# Patient Record
Sex: Female | Born: 1965 | Marital: Single | Smoking: Former smoker
Health system: Southern US, Community
[De-identification: ages and names within clinical notes are randomized; demographics above are authoritative.]

## PROBLEM LIST (undated history)

## (undated) DIAGNOSIS — T7840XA Allergy, unspecified, initial encounter: Secondary | ICD-10-CM

## (undated) DIAGNOSIS — I471 Supraventricular tachycardia, unspecified: Secondary | ICD-10-CM

## (undated) DIAGNOSIS — E785 Hyperlipidemia, unspecified: Secondary | ICD-10-CM

## (undated) DIAGNOSIS — B019 Varicella without complication: Secondary | ICD-10-CM

## (undated) HISTORY — DX: Allergy, unspecified, initial encounter: T78.40XA

## (undated) HISTORY — DX: Varicella without complication: B01.9

## (undated) HISTORY — DX: Hyperlipidemia, unspecified: E78.5

---
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
## 1994-11-01 HISTORY — PX: ABDOMINAL HYSTERECTOMY: SHX81

## 2003-11-02 HISTORY — PX: CHOLECYSTECTOMY: SHX55

## 2014-08-07 DIAGNOSIS — J45909 Unspecified asthma, uncomplicated: Secondary | ICD-10-CM | POA: Insufficient documentation

## 2016-06-07 ENCOUNTER — Encounter: Payer: Self-pay | Admitting: Family Medicine

## 2016-06-07 ENCOUNTER — Ambulatory Visit (INDEPENDENT_AMBULATORY_CARE_PROVIDER_SITE_OTHER): Payer: 59 | Admitting: Family Medicine

## 2016-06-07 VITALS — BP 110/78 | HR 59 | Temp 97.7°F | Resp 20 | Ht 66.25 in | Wt 153.0 lb

## 2016-06-07 DIAGNOSIS — Z130 Encounter for screening for diseases of the blood and blood-forming organs and certain disorders involving the immune mechanism: Secondary | ICD-10-CM | POA: Diagnosis not present

## 2016-06-07 DIAGNOSIS — M2550 Pain in unspecified joint: Secondary | ICD-10-CM

## 2016-06-07 DIAGNOSIS — Z0000 Encounter for general adult medical examination without abnormal findings: Secondary | ICD-10-CM | POA: Diagnosis not present

## 2016-06-07 DIAGNOSIS — Z1329 Encounter for screening for other suspected endocrine disorder: Secondary | ICD-10-CM

## 2016-06-07 DIAGNOSIS — Z13220 Encounter for screening for lipoid disorders: Secondary | ICD-10-CM

## 2016-06-07 DIAGNOSIS — E785 Hyperlipidemia, unspecified: Secondary | ICD-10-CM

## 2016-06-07 DIAGNOSIS — Z7189 Other specified counseling: Secondary | ICD-10-CM | POA: Diagnosis not present

## 2016-06-07 DIAGNOSIS — Z1321 Encounter for screening for nutritional disorder: Secondary | ICD-10-CM | POA: Diagnosis not present

## 2016-06-07 DIAGNOSIS — Z131 Encounter for screening for diabetes mellitus: Secondary | ICD-10-CM | POA: Diagnosis not present

## 2016-06-07 DIAGNOSIS — Z7689 Persons encountering health services in other specified circumstances: Secondary | ICD-10-CM

## 2016-06-07 LAB — CBC WITH DIFFERENTIAL/PLATELET
BASOS ABS: 0 {cells}/uL (ref 0–200)
Basophils Relative: 0 %
EOS PCT: 2 %
Eosinophils Absolute: 150 cells/uL (ref 15–500)
HEMATOCRIT: 41.9 % (ref 35.0–45.0)
HEMOGLOBIN: 13.8 g/dL (ref 11.7–15.5)
LYMPHS ABS: 2325 {cells}/uL (ref 850–3900)
Lymphocytes Relative: 31 %
MCH: 29.2 pg (ref 27.0–33.0)
MCHC: 32.9 g/dL (ref 32.0–36.0)
MCV: 88.8 fL (ref 80.0–100.0)
MONO ABS: 525 {cells}/uL (ref 200–950)
MPV: 11.6 fL (ref 7.5–12.5)
Monocytes Relative: 7 %
NEUTROS ABS: 4500 {cells}/uL (ref 1500–7800)
NEUTROS PCT: 60 %
Platelets: 239 10*3/uL (ref 140–400)
RBC: 4.72 MIL/uL (ref 3.80–5.10)
RDW: 13.8 % (ref 11.0–15.0)
WBC: 7.5 10*3/uL (ref 3.8–10.8)

## 2016-06-07 NOTE — Patient Instructions (Addendum)
Fish oil: 3g daily.   Health Maintenance, Female Adopting a healthy lifestyle and getting preventive care can go a long way to promote health and wellness. Talk with your health care provider about what schedule of regular examinations is right for you. This is a good chance for you to check in with your provider about disease prevention and staying healthy. In between checkups, there are plenty of things you can do on your own. Experts have done a lot of research about which lifestyle changes and preventive measures are most likely to keep you healthy. Ask your health care provider for more information. WEIGHT AND DIET  Eat a healthy diet  Be sure to include plenty of vegetables, fruits, low-fat dairy products, and lean protein.  Do not eat a lot of foods high in solid fats, added sugars, or salt.  Get regular exercise. This is one of the most important things you can do for your health.  Most adults should exercise for at least 150 minutes each week. The exercise should increase your heart rate and make you sweat (moderate-intensity exercise).  Most adults should also do strengthening exercises at least twice a week. This is in addition to the moderate-intensity exercise.  Maintain a healthy weight  Body mass index (BMI) is a measurement that can be used to identify possible weight problems. It estimates body fat based on height and weight. Your health care provider can help determine your BMI and help you achieve or maintain a healthy weight.  For females 22 years of age and older:   A BMI below 18.5 is considered underweight.  A BMI of 18.5 to 24.9 is normal.  A BMI of 25 to 29.9 is considered overweight.  A BMI of 30 and above is considered obese.  Watch levels of cholesterol and blood lipids  You should start having your blood tested for lipids and cholesterol at 50 years of age, then have this test every 5 years.  You may need to have your cholesterol levels checked more  often if:  Your lipid or cholesterol levels are high.  You are older than 50 years of age.  You are at high risk for heart disease.  CANCER SCREENING   Lung Cancer  Lung cancer screening is recommended for adults 57-35 years old who are at high risk for lung cancer because of a history of smoking.  A yearly low-dose CT scan of the lungs is recommended for people who:  Currently smoke.  Have quit within the past 15 years.  Have at least a 30-pack-year history of smoking. A pack year is smoking an average of one pack of cigarettes a day for 1 year.  Yearly screening should continue until it has been 15 years since you quit.  Yearly screening should stop if you develop a health problem that would prevent you from having lung cancer treatment.  Breast Cancer  Practice breast self-awareness. This means understanding how your breasts normally appear and feel.  It also means doing regular breast self-exams. Let your health care provider know about any changes, no matter how small.  If you are in your 20s or 30s, you should have a clinical breast exam (CBE) by a health care provider every 1-3 years as part of a regular health exam.  If you are 15 or older, have a CBE every year. Also consider having a breast X-ray (mammogram) every year.  If you have a family history of breast cancer, talk to your health care provider  about genetic screening.  If you are at high risk for breast cancer, talk to your health care provider about having an MRI and a mammogram every year.  Breast cancer gene (BRCA) assessment is recommended for women who have family members with BRCA-related cancers. BRCA-related cancers include:  Breast.  Ovarian.  Tubal.  Peritoneal cancers.  Results of the assessment will determine the need for genetic counseling and BRCA1 and BRCA2 testing. Cervical Cancer Your health care provider may recommend that you be screened regularly for cancer of the pelvic organs  (ovaries, uterus, and vagina). This screening involves a pelvic examination, including checking for microscopic changes to the surface of your cervix (Pap test). You may be encouraged to have this screening done every 3 years, beginning at age 70.  For women ages 55-65, health care providers may recommend pelvic exams and Pap testing every 3 years, or they may recommend the Pap and pelvic exam, combined with testing for human papilloma virus (HPV), every 5 years. Some types of HPV increase your risk of cervical cancer. Testing for HPV may also be done on women of any age with unclear Pap test results.  Other health care providers may not recommend any screening for nonpregnant women who are considered low risk for pelvic cancer and who do not have symptoms. Ask your health care provider if a screening pelvic exam is right for you.  If you have had past treatment for cervical cancer or a condition that could lead to cancer, you need Pap tests and screening for cancer for at least 20 years after your treatment. If Pap tests have been discontinued, your risk factors (such as having a new sexual partner) need to be reassessed to determine if screening should resume. Some women have medical problems that increase the chance of getting cervical cancer. In these cases, your health care provider may recommend more frequent screening and Pap tests. Colorectal Cancer  This type of cancer can be detected and often prevented.  Routine colorectal cancer screening usually begins at 50 years of age and continues through 50 years of age.  Your health care provider may recommend screening at an earlier age if you have risk factors for colon cancer.  Your health care provider may also recommend using home test kits to check for hidden blood in the stool.  A small camera at the end of a tube can be used to examine your colon directly (sigmoidoscopy or colonoscopy). This is done to check for the earliest forms of  colorectal cancer.  Routine screening usually begins at age 43.  Direct examination of the colon should be repeated every 5-10 years through 50 years of age. However, you may need to be screened more often if early forms of precancerous polyps or small growths are found. Skin Cancer  Check your skin from head to toe regularly.  Tell your health care provider about any new moles or changes in moles, especially if there is a change in a mole's shape or color.  Also tell your health care provider if you have a mole that is larger than the size of a pencil eraser.  Always use sunscreen. Apply sunscreen liberally and repeatedly throughout the day.  Protect yourself by wearing long sleeves, pants, a wide-brimmed hat, and sunglasses whenever you are outside. HEART DISEASE, DIABETES, AND HIGH BLOOD PRESSURE   High blood pressure causes heart disease and increases the risk of stroke. High blood pressure is more likely to develop in:  People who have  blood pressure in the high end of the normal range (130-139/85-89 mm Hg).  People who are overweight or obese.  People who are African American.  If you are 90-68 years of age, have your blood pressure checked every 3-5 years. If you are 35 years of age or older, have your blood pressure checked every year. You should have your blood pressure measured twice--once when you are at a hospital or clinic, and once when you are not at a hospital or clinic. Record the average of the two measurements. To check your blood pressure when you are not at a hospital or clinic, you can use:  An automated blood pressure machine at a pharmacy.  A home blood pressure monitor.  If you are between 47 years and 43 years old, ask your health care provider if you should take aspirin to prevent strokes.  Have regular diabetes screenings. This involves taking a blood sample to check your fasting blood sugar level.  If you are at a normal weight and have a low risk for  diabetes, have this test once every three years after 50 years of age.  If you are overweight and have a high risk for diabetes, consider being tested at a younger age or more often. PREVENTING INFECTION  Hepatitis B  If you have a higher risk for hepatitis B, you should be screened for this virus. You are considered at high risk for hepatitis B if:  You were born in a country where hepatitis B is common. Ask your health care provider which countries are considered high risk.  Your parents were born in a high-risk country, and you have not been immunized against hepatitis B (hepatitis B vaccine).  You have HIV or AIDS.  You use needles to inject street drugs.  You live with someone who has hepatitis B.  You have had sex with someone who has hepatitis B.  You get hemodialysis treatment.  You take certain medicines for conditions, including cancer, organ transplantation, and autoimmune conditions. Hepatitis C  Blood testing is recommended for:  Everyone born from [birth year] through 1965.  Anyone with known risk factors for hepatitis C. Sexually transmitted infections (STIs)  You should be screened for sexually transmitted infections (STIs) including gonorrhea and chlamydia if:  You are sexually active and are younger than 50 years of age.  You are older than 50 years of age and your health care provider tells you that you are at risk for this type of infection.  Your sexual activity has changed since you were last screened and you are at an increased risk for chlamydia or gonorrhea. Ask your health care provider if you are at risk.  If you do not have HIV, but are at risk, it may be recommended that you take a prescription medicine daily to prevent HIV infection. This is called pre-exposure prophylaxis (PrEP). You are considered at risk if:  You are sexually active and do not regularly use condoms or know the HIV status of your partner(s).  You take drugs by injection.  You are  sexually active with a partner who has HIV. Talk with your health care provider about whether you are at high risk of being infected with HIV. If you choose to begin PrEP, you should first be tested for HIV. You should then be tested every 3 months for as long as you are taking PrEP.  PREGNANCY   If you are premenopausal and you may become pregnant, ask your health care provider about  preconception counseling.  If you may become pregnant, take 400 to 800 micrograms (mcg) of folic acid every day.  If you want to prevent pregnancy, talk to your health care provider about birth control (contraception). OSTEOPOROSIS AND MENOPAUSE   Osteoporosis is a disease in which the bones lose minerals and strength with aging. This can result in serious bone fractures. Your risk for osteoporosis can be identified using a bone density scan.  If you are 43 years of age or older, or if you are at risk for osteoporosis and fractures, ask your health care provider if you should be screened.  Ask your health care provider whether you should take a calcium or vitamin D supplement to lower your risk for osteoporosis.  Menopause may have certain physical symptoms and risks.  Hormone replacement therapy may reduce some of these symptoms and risks. Talk to your health care provider about whether hormone replacement therapy is right for you.  HOME CARE INSTRUCTIONS   Schedule regular health, dental, and eye exams.  Stay current with your immunizations.   Do not use any tobacco products including cigarettes, chewing tobacco, or electronic cigarettes.  If you are pregnant, do not drink alcohol.  If you are breastfeeding, limit how much and how often you drink alcohol.  Limit alcohol intake to no more than 1 drink per day for nonpregnant women. One drink equals 12 ounces of beer, 5 ounces of wine, or 1 ounces of hard liquor.  Do not use street drugs.  Do not share needles.  Ask your health care provider for  help if you need support or information about quitting drugs.  Tell your health care provider if you often feel depressed.  Tell your health care provider if you have ever been abused or do not feel safe at home.   This information is not intended to replace advice given to you by your health care provider. Make sure you discuss any questions you have with your health care provider.   Document Released: 05/03/2011 Document Revised: 11/08/2014 Document Reviewed: 09/19/2013 Elsevier Interactive Patient Education [employer].

## 2016-06-07 NOTE — Progress Notes (Signed)
    Patient ID: Ashley Clayton, female  DOB: 03/02/1966, 50 y.o.   MRN: [id number] Patient Care Team    Relationship Specialty Notifications Start End  Renee A Kuneff, DO PCP - General Family Medicine  06/07/16     Subjective:  Ashley Clayton is a 50 y.o.  female present for new patient establishment. All past medical history, surgical history, allergies, family history, immunizations, medications and social history were Obtained and entered in the electronic medical record today. All recent labs, ED visits and hospitalizations within the last year were reviewed.  Hyperlipidemia: Patient presents for new patient establishment and states that she has been diagnosed with hyperlipidemia and hypertriglyceridemia in the past. She used to be on simvastatin but did not feel good on that medicine, so she was changed to Lipitor low dose which she did tolerate. She states she just stopped taking the medication because she did not want to take a statin. She reports she has had triglycerides higher than 1500. She has a family history of stroke and heart disease. She states she had a "normal "stress test a few years ago because she was having chest pain, that was found to be stress related.   Arthralgia: Patient also complains of arthralgia and her proximal and distal finger joints. She states she was on prednisone for a contact dermatitis a few weeks ago, and she felt that it made her arthritis pain much improved. She is worried this means that she has rheumatoid arthritis. States that he arthritis pain is painful enough that it is affecting her daily life. She has not taken any medications routinely for this pain.    Health maintenance:  Colonoscopy: completed about 7-10 years ago, by a doctor in a different state, resutls "normal". Had completed bc of diarrhea.  Mammogram: completed: about 2 years ago, cystic changes.. Breast cancer in [facility] (dies at 20 years).  Cervical cancer screening: last pap: 2016,  normal. Pt declines further pelvic, hysterectomy.  Immunizations: tdap 03/01/2014. Influenza UTD 2016(encouraged yearly) Infectious disease screening: Completed per pt, awaiting on records.  DEXA: never Assistive device: None Oxygen use: None  Patient has a Dental home. Hospitalizations/ED visits: None   There is no immunization history on file for this patient.   Past Medical History:  Diagnosis Date  . Allergy   . Chicken pox   . Hyperlipidemia    No Known Allergies Past Surgical History:  Procedure Laterality Date  . ABDOMINAL HYSTERECTOMY    . CHOLECYSTECTOMY    . TONSILLECTOMY AND ADENOIDECTOMY     Family History  Problem Relation Age of Onset  . Arthritis Mother   . Heart disease Mother   . Diabetes Mother   . Stroke Mother   . Heart disease Father   . Hearing loss Father   . Diabetes Father   . Arthritis Father   . Arthritis Sister   . Arthritis Brother   . Mental illness Brother   . Diabetes Brother   . Hearing loss Brother   . Heart disease Brother   . Early death Maternal Uncle   . Breast cancer Maternal Grandmother   . Early death Maternal Grandmother   . Heart disease Paternal Grandmother   . Mental illness Paternal Grandmother   . Drug abuse Paternal Grandmother   . Heart disease Paternal Grandfather   . Early death Paternal Grandfather   . Stroke Paternal Grandfather    Social History   Social History  . Marital status: Single      Spouse name: N/A  . Number of children: N/A  . Years of education: N/A   Occupational History  . Not on file.   Social History Main Topics  . Smoking status: Former Research scientist (life sciences)  . Smokeless tobacco: Never Used  . Alcohol use Yes     Comment: occasional  . Drug use: No  . Sexual activity: Yes    Birth control/ protection: None   Other Topics Concern  . Not on file   Social History Narrative  . No narrative on file     Medication List    as of 06/07/2016  2:29 PM   You have not been prescribed any  medications.      No results found for this or any previous visit (from the past 2160 hour(s)).  Patient was never admitted.   ROS: 14 pt review of systems performed and negative (unless mentioned in an HPI)  Objective: BP 110/78 (BP Location: Right Arm, Patient Position: Sitting, Cuff Size: Normal)   Pulse (!) 59   Temp 97.7 F (36.5 C) (Oral)   Resp 20   Ht 5' 6.25" (1.683 m)   Wt 153 lb (69.4 kg)   SpO2 99%   BMI 24.51 kg/m  Gen: Afebrile. No acute distress. Nontoxic in appearance, well-developed, well-nourished,  Obtained ENT: [facility]. Bilateral TM visualized and normal in appearance, normal external auditory canal. MMM, no oral lesions, adequate dentition. Bilateral nares within normal limits. Throat without erythema, ulcerations or exudates. No Cough on exano hoarseness on exam. Eyes:Pupils Equal Round Reactive to light, Extraocular movements intact,  Conjunctiva without redness, discharge or icterus. Neck/lymp/endocrine: Supple, no lymphadenopathy, not hyromegaly CV: RRR no murmur, no edema, +2/4 P posterior tibialis pulses.  Chest: CTAB, no wheeze, rhonchi or or crackles. Respiratory effort. Good Air movement. Abd: Soft. Flat. NTND. BS present. no Masses palpated. No hepatosplenomegaly. No rebound tenderness or guarding. Skin: no rashes, purpura or petechiae. Warm and well-perfused. Skin intact. Neuro/Msk:  Normal gait. PERLA. EOMi. Alert. Oriented x3.  Cranial nerves II through XII intact. Muscle strength 5/5 upper/lower extremity. DTRs equal bilaterally. Psych: Normal affect, dress and demeanor. Normal speech. Normal thought content and judgment.   Assessment/plan: Ashley Clayton is a 50 y.o. female present for new pt establishment and CPE.  Encounter for preventive health examination Patient was encouraged to exercise greater than 150 minutes a week. Patient was encouraged to choose a diet filled with fresh fruits and vegetables, and lean meats. Colonoscopy:  completed about 10 years ago, by a doctor in a different state, resutls "normal". Had completed bc of diarrhea. Will need repeated next year.  Mammogram: completed: about 2 years ago, cystic changes. Breast cancer in [facility] (died at 24 years). Would recommend yearly, pt does not desire yearly testing. mammogram 2018. SBE encouraged.  Cervical cancer screening: last pap: 2016, normal. Pt declines further pelvic, hysterectomy.  Immunizations: tdap 2015.. Influenza UTD 2016(encouraged yearly) Infectious disease screening: Completed per pt, awaiting on records.  DEXA: never, consider early screening next year. - CBC w/Dif - Vitamin D (25 hydroxy) - TSH - HgB A1c - Lipid panel  Arthralgia - Sed Rate (ESR) - C-reactive protein - Rheumatoid Factor - discussed daily NSAID therapy, consider mobic start once labs resulted.   Hyperlipidemia - Comp Met (CMET) - Lipid panel - she does not desire statin if possible - FHX MI and stroke.  - Encouraged dietary restrictions, exercise and 3g fish oil daily. Discussed the benefits of statin if labs elevated.  AVS provided to patient today for education/recommendation on gender specific health and safety maintenance.   No Follow-up on file.  Electronically signed by: Howard Pouch, DO [place]

## 2016-06-08 ENCOUNTER — Encounter: Payer: Self-pay | Admitting: *Deleted

## 2016-06-08 ENCOUNTER — Encounter: Payer: Self-pay | Admitting: Family Medicine

## 2016-06-08 ENCOUNTER — Telehealth: Payer: Self-pay | Admitting: Family Medicine

## 2016-06-08 DIAGNOSIS — M2550 Pain in unspecified joint: Secondary | ICD-10-CM | POA: Insufficient documentation

## 2016-06-08 DIAGNOSIS — E785 Hyperlipidemia, unspecified: Secondary | ICD-10-CM | POA: Insufficient documentation

## 2016-06-08 LAB — COMPREHENSIVE METABOLIC PANEL
ALBUMIN: 4.4 g/dL (ref 3.6–5.1)
ALT: 23 U/L (ref 6–29)
AST: 20 U/L (ref 10–35)
Alkaline Phosphatase: 105 U/L (ref 33–130)
BILIRUBIN TOTAL: 0.5 mg/dL (ref 0.2–1.2)
BUN: 13 mg/dL (ref 7–25)
CALCIUM: 9.8 mg/dL (ref 8.6–10.4)
CHLORIDE: 104 mmol/L (ref 98–110)
CO2: 25 mmol/L (ref 20–31)
CREATININE: 0.64 mg/dL (ref 0.50–1.05)
Glucose, Bld: 88 mg/dL (ref 65–99)
Potassium: 4.5 mmol/L (ref 3.5–5.3)
SODIUM: 140 mmol/L (ref 135–146)
TOTAL PROTEIN: 6.9 g/dL (ref 6.1–8.1)

## 2016-06-08 LAB — SEDIMENTATION RATE: SED RATE: 10 mm/h (ref 0–20)

## 2016-06-08 LAB — LIPID PANEL
Cholesterol: 281 mg/dL — ABNORMAL HIGH (ref 125–200)
HDL: 60 mg/dL (ref >=46)
LDL CALC: 169 mg/dL — AB (ref <130)
Total CHOL/HDL Ratio: 4.7 Ratio (ref <=5.0)
Triglycerides: 260 mg/dL — ABNORMAL HIGH (ref <150)
VLDL: 52 mg/dL — ABNORMAL HIGH (ref <30)

## 2016-06-08 LAB — TSH: TSH: 1.29 m[IU]/L

## 2016-06-08 LAB — HEMOGLOBIN A1C
HEMOGLOBIN A1C: 5.7 % — AB (ref <5.7)
MEAN PLASMA GLUCOSE: 117 mg/dL

## 2016-06-08 LAB — RHEUMATOID FACTOR

## 2016-06-08 LAB — VITAMIN D 25 HYDROXY (VIT D DEFICIENCY, FRACTURES): VIT D 25 HYDROXY: 32 ng/mL (ref 30–100)

## 2016-06-08 LAB — C-REACTIVE PROTEIN

## 2016-06-08 MED ORDER — MELOXICAM 15 MG PO TABS: 15.0000 mg | ORAL_TABLET | Freq: Every day | ORAL | 5 refills | Status: DC

## 2016-06-08 NOTE — Telephone Encounter (Addendum)
Please call patient: - All of her labs are normal except for her lipid panel and A1c. - A1c is 5.7 this is elevated, this is not diabetic and after 6.5 but she is at increased risk if this continues to increase. However encourage her to watch the carbohydrate/sugar content in her diet and exercise at least 150 minutes a week. - also, we did not order her mammogram, the last I can see it was completed around 2014, would she like to place an order for her mammograms this year? - Her lipid panel is also elevated, which she was anticipating. Her cholesterol is 281, triglycerides 260 and the "bad cholesterol" is 169. Her good cholesterol is great at 60. She would likely benefit from a low-dose statin, but she voiced concern and wanted to try to avoid this. I have encouraged her to take 3 g of fish oil a day. If she desires she can start the 3 g of fish oil daily, increase her exercise, and strictly watch her diet include more fresh fruits and vegetables, higher fiber, low saturated fat and fructose. We will retest this in 6 months along with her A1c. Hopefully with dietary and behavior modifications we can avoid statin medication. F/U 6 mos with labs and provider. - All of her inflammatory markers are negative including her rheumatoid factor, therefore this doesn't seem like her pain is coming from rheumatoid arthritis. This is likely osteoarthritis, and a daily NSAID therapy could be beneficial. I have called and meloxicam, which is the medication we discussed she is to take it daily with a meal.

## 2016-06-08 NOTE — Telephone Encounter (Signed)
Sent lab results and instructions in My Chart. Left message on patient voice mail that results were sent in my chart.

## 2016-09-11 DIAGNOSIS — R05 Cough: Secondary | ICD-10-CM | POA: Diagnosis not present

## 2016-09-11 DIAGNOSIS — J0100 Acute maxillary sinusitis, unspecified: Secondary | ICD-10-CM | POA: Diagnosis not present

## 2016-10-04 DIAGNOSIS — J209 Acute bronchitis, unspecified: Secondary | ICD-10-CM | POA: Diagnosis not present

## 2016-10-09 DIAGNOSIS — J209 Acute bronchitis, unspecified: Secondary | ICD-10-CM | POA: Diagnosis not present

## 2017-01-05 ENCOUNTER — Encounter: Payer: Self-pay | Admitting: *Deleted

## 2017-07-25 ENCOUNTER — Encounter: Payer: Self-pay | Admitting: Family Medicine

## 2017-07-25 ENCOUNTER — Ambulatory Visit (INDEPENDENT_AMBULATORY_CARE_PROVIDER_SITE_OTHER): Payer: BLUE CROSS/BLUE SHIELD | Admitting: Family Medicine

## 2017-07-25 VITALS — BP 123/80 | HR 67 | Temp 98.4°F | Resp 20 | Wt 156.0 lb

## 2017-07-25 DIAGNOSIS — J320 Chronic maxillary sinusitis: Secondary | ICD-10-CM

## 2017-07-25 MED ORDER — PREDNISONE 50 MG PO TABS: ORAL_TABLET | ORAL | 0 refills | Status: DC

## 2017-07-25 MED ORDER — DOXYCYCLINE HYCLATE 100 MG PO TABS: 100.0000 mg | ORAL_TABLET | Freq: Two times a day (BID) | ORAL | 0 refills | Status: DC

## 2017-07-25 NOTE — Patient Instructions (Signed)
Rest, hydrate.  + flonase, mucinex (DM if cough), nettie pot or nasal saline.  Doxycyline prescribed, take until completed.  Prednisone burst. If cough present it can last up to 6-8 weeks.  F/U 2 weeks of not improved.     Sinusitis, Adult Sinusitis is soreness and inflammation of your sinuses. Sinuses are hollow spaces in the bones around your face. They are located:  Around your eyes.  In the middle of your forehead.  Behind your nose.  In your cheekbones.  Your sinuses and nasal passages are lined with a stringy fluid (mucus). Mucus normally drains out of your sinuses. When your nasal tissues get inflamed or swollen, the mucus can get trapped or blocked so air cannot flow through your sinuses. This lets bacteria, viruses, and funguses grow, and that leads to infection. Follow these instructions at home: Medicines  Take, use, or apply over-the-counter and prescription medicines only as told by your doctor. These may include nasal sprays.  If you were prescribed an antibiotic medicine, take it as told by your doctor. Do not stop taking the antibiotic even if you start to feel better. Hydrate and Humidify  Drink enough water to keep your pee (urine) clear or pale yellow.  Use a cool mist humidifier to keep the humidity level in your home above 50%.  Breathe in steam for 10-15 minutes, 3-4 times a day or as told by your doctor. You can do this in the bathroom while a hot shower is running.  Try not to spend time in cool or dry air. Rest  Rest as much as possible.  Sleep with your head raised (elevated).  Make sure to get enough sleep each night. General instructions  Put a warm, moist washcloth on your face 3-4 times a day or as told by your doctor. This will help with discomfort.  Wash your hands often with soap and water. If there is no soap and water, use hand sanitizer.  Do not smoke. Avoid being around people who are smoking (secondhand smoke).  Keep all  follow-up visits as told by your doctor. This is important. Contact a doctor if:  You have a fever.  Your symptoms get worse.  Your symptoms do not get better within 10 days. Get help right away if:  You have a very bad headache.  You cannot stop throwing up (vomiting).  You have pain or swelling around your face or eyes.  You have trouble seeing.  You feel confused.  Your neck is stiff.  You have trouble breathing. This information is not intended to replace advice given to you by your health care provider. Make sure you discuss any questions you have with your health care provider. Document Released: 04/05/2008 Document Revised: 06/13/2016 Document Reviewed: 08/13/2015 Elsevier Interactive Patient Education  2018 [employer]   

## 2017-07-25 NOTE — Progress Notes (Signed)
Ashley Clayton , [birth year], 51 y.o., female MRN: [id number] Patient Care Team    Relationship Specialty Notifications Start End  Natalia Leatherwood, DO PCP - General Family Medicine  06/07/16     Chief Complaint  Patient presents with  . URI    congestion,chest congestion x 1 week     Subjective: Pt presents for an OV with complaints of Cough of one-week duration.  Associated symptoms include nasal drainage, sore throat, chest congestion, sinus pressure, nausea and fatigue. Patient denies fever, chills, vomiting or diarrhea. Pt has tried  Flonase, DayQuil and Sudafed to ease their symptoms.  She has used an albuterol inhaler a few times for the chest tightness, and has continued her Singulair.  Depression screen [facility] 2/9 07/25/2017 06/07/2016  Decreased Interest 0 0  Down, Depressed, Hopeless 0 0  PHQ - 2 Score 0 0    No Known Allergies Social History  Substance Use Topics  . Smoking status: Former Smoker    Types: Cigarettes  . Smokeless tobacco: Never Used  . Alcohol use Yes     Comment: occasional   Past Medical History:  Diagnosis Date  . Allergy   . Chicken pox   . Hyperlipidemia   . Hyperlipidemia    Past Surgical History:  Procedure Laterality Date  . ABDOMINAL HYSTERECTOMY  1996  . CHOLECYSTECTOMY  2005  . TONSILLECTOMY AND ADENOIDECTOMY  1972   Family History  Problem Relation Age of Onset  . Arthritis Mother   . Heart disease Mother   . Diabetes Mother   . Stroke Mother   . Heart disease Father   . Hearing loss Father   . Diabetes Father   . Arthritis Father   . Arthritis Sister   . Arthritis Brother   . Mental illness Brother   . Diabetes Brother   . Hearing loss Brother   . Heart disease Brother   . Early death Maternal Uncle   . Breast cancer Maternal Grandmother   . Early death Maternal Grandmother   . Heart disease Paternal Grandmother   . Mental illness Paternal Grandmother   . Drug abuse Paternal Grandmother   . Heart disease Paternal  Grandfather   . Early death Paternal Grandfather   . Stroke Paternal Grandfather    Allergies as of 07/25/2017   No Known Allergies     Medication List       Accurate as of 07/25/17  4:09 PM. Always use your most recent med list.          meloxicam 15 MG tablet Commonly known as:  MOBIC Take 1 tablet (15 mg total) by mouth daily.       All past medical history, surgical history, allergies, family history, immunizations andmedications were updated in the EMR today and reviewed under the history and medication portions of their EMR.     ROS: Negative, with the exception of above mentioned in HPI   Objective:  BP 123/80 (BP Location: Left Arm, Patient Position: Sitting, Cuff Size: Normal)   Pulse 67   Temp 98.4 F (36.9 C)   Resp 20   Wt 156 lb (70.8 kg)   SpO2 98%   BMI 24.99 kg/m  Body mass index is 24.99 kg/m. Gen: Afebrile. No acute distress. Nontoxic in appearance, well developed, well nourished.  HENT: AT. [place]. Bilateral TM visualized  mild fullness, no erythema. MMM, no oral lesions. Bilateral nares  with erythema and drainage. Throat without erythema or exudates.  Cough  present, tenderness to palpation bilateral maxillary sinuses, hoarseness present. Eyes:Pupils Equal Round Reactive to light, Extraocular movements intact,  Conjunctiva without redness, discharge or icterus. Neck/lymp/endocrine: Supple, no lymphadenopathy CV: RRR  Chest: CTAB, no wheeze or crackles. Good air movement, normal resp effort.  Skin:  No rashes, purpura or petechiae.   No exam data present No results found. No results found for this or any previous visit (from the past 24 hour(s)).  Assessment/Plan: Ashley Clayton is a 51 y.o. female present for OV for  1. Maxillary sinusitis, unspecified chronicity Rest, hydrate, continue Flonase, start Mucinex and nasal saline. Discontinue Sudafed use. Doxycycline twice a day 10 days, prednisone burst 5 days Follow-up in 2 weeks if not  improved   Reviewed expectations re: course of current medical issues.  Discussed self-management of symptoms.  Outlined signs and symptoms indicating need for more acute intervention.  Patient verbalized understanding and all questions were answered.  Patient received an After-Visit Summary.    No orders of the defined types were placed in this encounter.    Note is dictated utilizing voice recognition software. Although note has been proof read prior to signing, occasional typographical errors still can be missed. If any questions arise, please do not hesitate to call for verification.   electronically signed by:  Felix Pacini, DO  [facility] [place]

## 2017-08-11 ENCOUNTER — Encounter: Payer: Self-pay | Admitting: Family Medicine

## 2017-08-11 ENCOUNTER — Ambulatory Visit (INDEPENDENT_AMBULATORY_CARE_PROVIDER_SITE_OTHER)
Admission: RE | Admit: 2017-08-11 | Discharge: 2017-08-11 | Disposition: A | Discharge status: Home / Self Care | Payer: BLUE CROSS/BLUE SHIELD | Source: Ambulatory Visit | Attending: Family Medicine | Admitting: Family Medicine

## 2017-08-11 ENCOUNTER — Ambulatory Visit (INDEPENDENT_AMBULATORY_CARE_PROVIDER_SITE_OTHER): Payer: BLUE CROSS/BLUE SHIELD | Admitting: Family Medicine

## 2017-08-11 VITALS — BP 106/68 | HR 73 | Temp 98.3°F | Resp 20 | Ht 66.0 in | Wt 153.2 lb

## 2017-08-11 DIAGNOSIS — K5900 Constipation, unspecified: Secondary | ICD-10-CM

## 2017-08-11 DIAGNOSIS — R142 Eructation: Secondary | ICD-10-CM

## 2017-08-11 DIAGNOSIS — R140 Abdominal distension (gaseous): Secondary | ICD-10-CM

## 2017-08-11 DIAGNOSIS — R1084 Generalized abdominal pain: Secondary | ICD-10-CM

## 2017-08-11 LAB — CBC WITH DIFFERENTIAL/PLATELET
BASOS ABS: 0 10*3/uL (ref 0.0–0.1)
Basophils Relative: 0.7 % (ref 0.0–3.0)
EOS ABS: 0.1 10*3/uL (ref 0.0–0.7)
Eosinophils Relative: 1.9 % (ref 0.0–5.0)
HEMATOCRIT: 40.3 % (ref 36.0–46.0)
Hemoglobin: 13.1 g/dL (ref 12.0–15.0)
LYMPHS PCT: 31.9 % (ref 12.0–46.0)
Lymphs Abs: 1.5 10*3/uL (ref 0.7–4.0)
MCHC: 32.6 g/dL (ref 30.0–36.0)
MCV: 90.4 fl (ref 78.0–100.0)
MONOS PCT: 7.9 % (ref 3.0–12.0)
Monocytes Absolute: 0.4 10*3/uL (ref 0.1–1.0)
NEUTROS ABS: 2.7 10*3/uL (ref 1.4–7.7)
NEUTROS PCT: 57.6 % (ref 43.0–77.0)
PLATELETS: 215 10*3/uL (ref 150.0–400.0)
RBC: 4.45 Mil/uL (ref 3.87–5.11)
RDW: 13.5 % (ref 11.5–15.5)
WBC: 4.7 10*3/uL (ref 4.0–10.5)

## 2017-08-11 NOTE — Patient Instructions (Signed)
Miralax 1 cap in 8 ounces of water every 12 hours until you have a BM. Then decrease to 1 cap a day. Align probiotic daily.  Hydrate--> 80-100 ounces of water a day.   Get xray today at [place] office.     Constipation, Adult Constipation is when a person:  Poops (has a bowel movement) fewer times in a week than normal.  Has a hard time pooping.  Has poop that is dry, hard, or bigger than normal.  Follow these instructions at home: Eating and drinking   Eat foods that have a lot of fiber, such as: ? Fresh fruits and vegetables. ? Whole grains. ? Beans.  Eat less of foods that are high in fat, low in fiber, or overly processed, such as: ? [place] fries. ? Hamburgers. ? Cookies. ? Candy. ? Soda.  Drink enough fluid to keep your pee (urine) clear or pale yellow. General instructions  Exercise regularly or as told by your doctor.  Go to the restroom when you feel like you need to poop. Do not hold it in.  Take over-the-counter and prescription medicines only as told by your doctor. These include any fiber supplements.  Do pelvic floor retraining exercises, such as: ? Doing deep breathing while relaxing your lower belly (abdomen). ? Relaxing your pelvic floor while pooping.  Watch your condition for any changes.  Keep all follow-up visits as told by your doctor. This is important. Contact a doctor if:  You have pain that gets worse.  You have a fever.  You have not pooped for 4 days.  You throw up (vomit).  You are not hungry.  You lose weight.  You are bleeding from the anus.  You have thin, pencil-like poop (stool). Get help right away if:  You have a fever, and your symptoms suddenly get worse.  You leak poop or have blood in your poop.  Your belly feels hard or bigger than normal (is bloated).  You have very bad belly pain.  You feel dizzy or you faint. This information is not intended to replace advice given to you by your health care provider.  Make sure you discuss any questions you have with your health care provider. Document Released: 04/05/2008 Document Revised: 05/07/2016 Document Reviewed: 04/07/2016 Elsevier Interactive Patient Education  2017 [employer].

## 2017-08-11 NOTE — Progress Notes (Signed)
Ashley Clayton , [birth year], 51 y.o., female MRN: [id number] Patient Care Team    Relationship Specialty Notifications Start End  Ma Hillock, DO PCP - General Family Medicine  06/07/16     Chief Complaint  Patient presents with  . Abdominal Pain    nausea,bloating,constipation x 1 week had BM yesterday     Subjective:  Abdominal pain: Patient presents today for abdominal cramping and pain of one-week duration. Patient had been recently treated with doxycycline for maxillary sinusitis which she states has resolved. Over the last week however she has "felt bad ". 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 She reports one episode of diarrhea approximately one week ago and then no bowel movement until last night. She states that the bowel movement was very hard, painful to pass but she did have a very large amount of stool production. She endorses bloating, nausea, hot flashes, feeling hungry but unable to eat and chest discomfort. She reports the nausea and chest discomfort has subsided since having a bowel movement. She tried a Dulcolax suppository a few days ago, and reports he created no movement. She admits she is under increased stress at work. She denies any dietary changes. She denies any rectal bleeding.  Depression screen [facility] 2/9 07/25/2017 06/07/2016  Decreased Interest 0 0  Down, Depressed, Hopeless 0 0  PHQ - 2 Score 0 0    No Known Allergies Social History  Substance Use Topics  . Smoking status: Former Smoker    Types: Cigarettes  . Smokeless tobacco: Never Used  . Alcohol use Yes     Comment: occasional   Past Medical History:  Diagnosis Date  . Allergy   . Chicken pox   . Hyperlipidemia   . Hyperlipidemia    Past Surgical History:  Procedure Laterality Date  . ABDOMINAL HYSTERECTOMY  1996  . CHOLECYSTECTOMY  2005  . TONSILLECTOMY AND ADENOIDECTOMY  1972   Family History  Problem Relation Age of Onset  . Arthritis Mother   . Heart disease  Mother   . Diabetes Mother   . Stroke Mother   . Heart disease Father   . Hearing loss Father   . Diabetes Father   . Arthritis Father   . Arthritis Sister   . Arthritis Brother   . Mental illness Brother   . Diabetes Brother   . Hearing loss Brother   . Heart disease Brother   . Early death Maternal Uncle   . Breast cancer Maternal Grandmother   . Early death Maternal Grandmother   . Heart disease Paternal Grandmother   . Mental illness Paternal Grandmother   . Drug abuse Paternal Grandmother   . Heart disease Paternal Grandfather   . Early death Paternal Grandfather   . Stroke Paternal Grandfather    Allergies as of 08/11/2017   No Known Allergies     Medication List    as of 08/11/2017  8:05 AM   You have not been prescribed any medications.     All past medical history, surgical history, allergies, family history, immunizations andmedications were updated in the EMR today and reviewed under the history and medication portions of their EMR.     ROS: Negative, with the exception of above mentioned in HPI   Objective:  BP 106/68 (BP Location: Right Arm, Patient Position: Sitting, Cuff Size: Normal)   Pulse 73   Temp 98.3 F (36.8 C)   Resp 20   Ht _0  (  1.676 m)   Wt 153 lb 4 oz (69.5 kg)   SpO2 97%   BMI 24.74 kg/m  Body mass index is 24.74 kg/m. Gen: Afebrile. No acute distress. Nontoxic in appearance, appears fatigued. HENT: AT. [place].  MMM.  Eyes:Pupils Equal Round Reactive to light, Extraocular movements intact,  Conjunctiva without redness, discharge or icterus. Neck/lymp/endocrine: Supple, no lymphadenopathy, no thyromegaly CV: RRR no murmur, no edema, +2/4 P posterior tibialis pulses Chest: CTAB, no wheeze or crackles Abd: Soft. Flat. Moderate stool burden present. Moderate diffuse tenderness to palpation. BS present. No Masses palpated.  Skin: NO rashes, purpura or petechiae.  Neuro:  Normal gait. PERLA. EOMi. Alert. Oriented X3  Psych: Normal  affect, dress and demeanor. Normal speech. Normal thought content and judgment..    No exam data present No results found. No results found for this or any previous visit (from the past 24 hour(s)).  Assessment/Plan: Ashley Clayton is a 51 y.o. female present for OV for  Generalized abdominal pain/constipation/bloating/belching - Afebrile. No acute abdomen. Patient does appear to have moderate stool burden on exam. She does appear to be uncomfortable. Diffuse discomfort with palpation. She has had a BM yesterday and tolerating PO. - Recommended 1 Of MiraLAX in 8 ounces of water twice a day until bowel movement, then decrease to 1 In 8 ounces of water daily until bowels are regulated. - Start align probiotic daily for 8 weeks. - Given she had a large bowel movement yesterday evening, encouraged her to try another Dulcolax suppository today. Obtain abdominal x-ray and labs today rule out infection, thyroid or inflammatory process. - DG Abd 2 Views; Future - CBC w/Diff - Comp Met (CMET) - TSH - C-reactive protein - H. pylori antibody, IgG - Patient will be contacted with results once available up with labs and imaging studies. Patient was advised if condition worsens she can be seen emergently. If no improvement in condition would want to see within 1 week.   Reviewed expectations re: course of current medical issues.  Discussed self-management of symptoms.  Outlined signs and symptoms indicating need for more acute intervention.  Patient verbalized understanding and all questions were answered.  Patient received an After-Visit Summary.    No orders of the defined types were placed in this encounter.    Note is dictated utilizing voice recognition software. Although note has been proof read prior to signing, occasional typographical errors still can be missed. If any questions arise, please do not hesitate to call for verification.   electronically signed by:  Howard Pouch, DO   [place]

## 2017-08-12 LAB — COMPREHENSIVE METABOLIC PANEL
ALK PHOS: 93 U/L (ref 39–117)
ALT: 16 U/L (ref 0–35)
AST: 16 U/L (ref 0–37)
Albumin: 4.1 g/dL (ref 3.5–5.2)
BILIRUBIN TOTAL: 0.5 mg/dL (ref 0.2–1.2)
BUN: 16 mg/dL (ref 6–23)
CALCIUM: 8.7 mg/dL (ref 8.4–10.5)
CO2: 29 meq/L (ref 19–32)
CREATININE: 0.78 mg/dL (ref 0.40–1.20)
Chloride: 105 mEq/L (ref 96–112)
GFR: 82.53 mL/min (ref >60.00)
GLUCOSE: 97 mg/dL (ref 70–99)
Potassium: 4.4 mEq/L (ref 3.5–5.1)
Sodium: 142 mEq/L (ref 135–145)
TOTAL PROTEIN: 6.5 g/dL (ref 6.0–8.3)

## 2017-08-12 LAB — H. PYLORI ANTIBODY, IGG: H Pylori IgG: NEGATIVE

## 2017-08-12 LAB — C-REACTIVE PROTEIN: CRP: 0.5 mg/dL (ref 0.5–20.0)

## 2017-08-12 LAB — TSH: TSH: 1.35 u[IU]/mL (ref 0.35–4.50)

## 2017-11-09 DIAGNOSIS — H524 Presbyopia: Secondary | ICD-10-CM | POA: Diagnosis not present

## 2017-11-09 DIAGNOSIS — H25013 Cortical age-related cataract, bilateral: Secondary | ICD-10-CM | POA: Diagnosis not present

## 2017-11-09 DIAGNOSIS — H2513 Age-related nuclear cataract, bilateral: Secondary | ICD-10-CM | POA: Diagnosis not present

## 2017-11-09 DIAGNOSIS — H31012 Macula scars of posterior pole (postinflammatory) (post-traumatic), left eye: Secondary | ICD-10-CM | POA: Diagnosis not present

## 2017-11-24 DIAGNOSIS — Z6825 Body mass index (BMI) 25.0-25.9, adult: Secondary | ICD-10-CM | POA: Diagnosis not present

## 2017-11-24 DIAGNOSIS — Z01419 Encounter for gynecological examination (general) (routine) without abnormal findings: Secondary | ICD-10-CM | POA: Diagnosis not present

## 2017-12-13 DIAGNOSIS — Z13820 Encounter for screening for osteoporosis: Secondary | ICD-10-CM | POA: Diagnosis not present

## 2017-12-13 DIAGNOSIS — Z1231 Encounter for screening mammogram for malignant neoplasm of breast: Secondary | ICD-10-CM | POA: Diagnosis not present

## 2017-12-27 DIAGNOSIS — R194 Change in bowel habit: Secondary | ICD-10-CM | POA: Diagnosis not present

## 2017-12-27 DIAGNOSIS — R1013 Epigastric pain: Secondary | ICD-10-CM | POA: Diagnosis not present

## 2017-12-27 DIAGNOSIS — R140 Abdominal distension (gaseous): Secondary | ICD-10-CM | POA: Diagnosis not present

## 2017-12-27 DIAGNOSIS — R1033 Periumbilical pain: Secondary | ICD-10-CM | POA: Diagnosis not present

## 2017-12-28 LAB — TSH: TSH: 1.92 (ref 0.41–5.90)

## 2018-01-03 ENCOUNTER — Encounter: Payer: Self-pay | Admitting: *Deleted

## 2018-01-10 DIAGNOSIS — R194 Change in bowel habit: Secondary | ICD-10-CM | POA: Diagnosis not present

## 2018-01-10 DIAGNOSIS — R140 Abdominal distension (gaseous): Secondary | ICD-10-CM | POA: Diagnosis not present

## 2018-02-15 ENCOUNTER — Encounter (HOSPITAL_COMMUNITY): Payer: Self-pay | Admitting: Emergency Medicine

## 2018-02-15 ENCOUNTER — Emergency Department (HOSPITAL_COMMUNITY): Payer: BLUE CROSS/BLUE SHIELD

## 2018-02-15 ENCOUNTER — Emergency Department (HOSPITAL_COMMUNITY)
Admission: EM | Admit: 2018-02-15 | Discharge: 2018-02-15 | Disposition: A | Discharge status: Home / Self Care | Payer: BLUE CROSS/BLUE SHIELD | Attending: Emergency Medicine | Admitting: Emergency Medicine

## 2018-02-15 DIAGNOSIS — Z87891 Personal history of nicotine dependence: Secondary | ICD-10-CM | POA: Diagnosis not present

## 2018-02-15 DIAGNOSIS — R079 Chest pain, unspecified: Secondary | ICD-10-CM | POA: Diagnosis not present

## 2018-02-15 DIAGNOSIS — R55 Syncope and collapse: Secondary | ICD-10-CM

## 2018-02-15 DIAGNOSIS — R0789 Other chest pain: Secondary | ICD-10-CM | POA: Diagnosis not present

## 2018-02-15 DIAGNOSIS — R05 Cough: Secondary | ICD-10-CM | POA: Diagnosis not present

## 2018-02-15 HISTORY — DX: Supraventricular tachycardia, unspecified: I47.10

## 2018-02-15 HISTORY — DX: Supraventricular tachycardia: I47.1

## 2018-02-15 LAB — CBC WITH DIFFERENTIAL/PLATELET
BASOS PCT: 1 %
Basophils Absolute: 0.1 10*3/uL (ref 0.0–0.1)
Eosinophils Absolute: 0.1 10*3/uL (ref 0.0–0.7)
Eosinophils Relative: 1 %
HEMATOCRIT: 40.4 % (ref 36.0–46.0)
HEMOGLOBIN: 13.2 g/dL (ref 12.0–15.0)
LYMPHS ABS: 2.2 10*3/uL (ref 0.7–4.0)
LYMPHS PCT: 38 %
MCH: 28.8 pg (ref 26.0–34.0)
MCHC: 32.7 g/dL (ref 30.0–36.0)
MCV: 88.2 fL (ref 78.0–100.0)
Monocytes Absolute: 0.3 10*3/uL (ref 0.1–1.0)
Monocytes Relative: 5 %
NEUTROS ABS: 3.3 10*3/uL (ref 1.7–7.7)
NEUTROS PCT: 55 %
Platelets: 249 10*3/uL (ref 150–400)
RBC: 4.58 MIL/uL (ref 3.87–5.11)
RDW: 13.3 % (ref 11.5–15.5)
WBC: 5.9 10*3/uL (ref 4.0–10.5)

## 2018-02-15 LAB — COMPREHENSIVE METABOLIC PANEL WITH GFR
ALT: 24 U/L (ref 14–54)
AST: 31 U/L (ref 15–41)
Albumin: 4 g/dL (ref 3.5–5.0)
Alkaline Phosphatase: 95 U/L (ref 38–126)
Anion gap: 12 (ref 5–15)
BUN: 11 mg/dL (ref 6–20)
CO2: 22 mmol/L (ref 22–32)
Calcium: 9.2 mg/dL (ref 8.9–10.3)
Chloride: 106 mmol/L (ref 101–111)
Creatinine, Ser: 0.99 mg/dL (ref 0.44–1.00)
GFR calc Af Amer: >60 mL/min
GFR calc non Af Amer: >60 mL/min
Glucose, Bld: 103 mg/dL — ABNORMAL HIGH (ref 65–99)
Potassium: 3.7 mmol/L (ref 3.5–5.1)
Sodium: 140 mmol/L (ref 135–145)
Total Bilirubin: 0.9 mg/dL (ref 0.3–1.2)
Total Protein: 6.9 g/dL (ref 6.5–8.1)

## 2018-02-15 LAB — I-STAT TROPONIN, ED
TROPONIN I, POC: 0 ng/mL (ref 0.00–0.08)
Troponin i, poc: 0 ng/mL (ref 0.00–0.08)

## 2018-02-15 LAB — TROPONIN I: Troponin I: <0.03 ng/mL

## 2018-02-15 LAB — D-DIMER, QUANTITATIVE: D-Dimer, Quant: <0.27 ug/mL-FEU (ref 0.00–0.50)

## 2018-02-15 MED ORDER — GI COCKTAIL ~~LOC~~
30.0000 mL | Freq: Once | ORAL | Status: AC
  Administered 2018-02-15: 30 mL via ORAL
  Filled 2018-02-15: qty 30

## 2018-02-15 NOTE — ED Provider Notes (Signed)
[facility] EMERGENCY DEPARTMENT Provider Note   CSN: [id number] Arrival date & time: 02/15/18  1235     History   Chief Complaint Chief Complaint  Patient presents with  . Chest Pain    HPI Ashley Clayton is a 52 y.o. female.  Patient with past history of SVT, high cholesterol currently untreated -- presents the emergency department today with near syncope and chest pain which started around 10:45 this morning while she was eating.  Symptoms progressed over several minutes and she became very lightheaded, diaphoretic and nauseous.  She developed right sided chest pain which radiated to her right arm and shoulder.  This has persisted.  She reports being under a lot of stress recently.  No reported fevers, URI symptoms, chest cough.  No vomiting or diarrhea.  No abdominal pain.  Patient has a considerable family history significant for multiple members who have had bypass surgery.  Patient had a negative exercise stress test approximately 5-6 years ago.  She does not smoke and does not have high blood pressure or diabetes. Patient denies risk factors for pulmonary embolism including: unilateral leg swelling, history of DVT/PE/other blood clots, use of exogenous hormones, recent immobilizations, recent surgery, malignancy, hemoptysis.  She was just on a flight from [place] which was delayed and patient was on the plane for about 4 hours.  No lower extremity swelling however.      Past Medical History:  Diagnosis Date  . Allergy   . Chicken pox   . Hyperlipidemia   . Hyperlipidemia   . SVT (supraventricular tachycardia) (HCC)    20 years ago    Patient Active Problem List   Diagnosis Date Noted  . Hyperlipidemia 06/08/2016  . Arthralgia 06/08/2016    Past Surgical History:  Procedure Laterality Date  . ABDOMINAL HYSTERECTOMY  1996  . CHOLECYSTECTOMY  2005  . TONSILLECTOMY AND ADENOIDECTOMY  1972     OB History   None      Home Medications     Prior to Admission medications   Not on File    Family History Family History  Problem Relation Age of Onset  . Arthritis Mother   . Heart disease Mother   . Diabetes Mother   . Stroke Mother   . Heart disease Father   . Hearing loss Father   . Diabetes Father   . Arthritis Father   . Arthritis Sister   . Arthritis Brother   . Mental illness Brother   . Diabetes Brother   . Hearing loss Brother   . Heart disease Brother   . Early death Maternal Uncle   . Breast cancer Maternal Grandmother   . Early death Maternal Grandmother   . Heart disease Paternal Grandmother   . Mental illness Paternal Grandmother   . Drug abuse Paternal Grandmother   . Heart disease Paternal Grandfather   . Early death Paternal Grandfather   . Stroke Paternal Grandfather     Social History Social History   Tobacco Use  . Smoking status: Former Smoker    Types: Cigarettes  . Smokeless tobacco: Never Used  Substance Use Topics  . Alcohol use: Yes    Comment: occasional  . Drug use: No     Allergies   Patient has no known allergies.   Review of Systems Review of Systems  Constitutional: Negative for diaphoresis and fever.  Eyes: Negative for redness.  Respiratory: Negative for cough and shortness of breath.   Cardiovascular: Positive for  chest pain. Negative for palpitations and leg swelling.  Gastrointestinal: Negative for abdominal pain, nausea and vomiting.  Genitourinary: Negative for dysuria.  Musculoskeletal: Negative for back pain and neck pain.  Skin: Negative for rash.  Neurological: Positive for light-headedness. Negative for syncope.  Psychiatric/Behavioral: The patient is not nervous/anxious.      Physical Exam Updated Vital Signs BP 106/73 (BP Location: Right Arm)   Pulse 75   Temp 99.6 F (37.6 C) (Oral)   Resp 14   Ht 5\' 6"  (1.676 m)   Wt 72.6 kg (160 lb)   SpO2 98%   BMI 25.82 kg/m   Physical Exam  Constitutional: She appears well-developed and  well-nourished.  HENT:  Head: Normocephalic and atraumatic.  Mouth/Throat: Mucous membranes are normal. Mucous membranes are not dry.  Eyes: Conjunctivae are normal.  Neck: Trachea normal and normal range of motion. Neck supple. Normal carotid pulses and no JVD present. No muscular tenderness present. Carotid bruit is not present. No tracheal deviation present.  Cardiovascular: Normal rate, regular rhythm, S1 normal, S2 normal, normal heart sounds and intact distal pulses. Exam reveals no decreased pulses.  No murmur heard. Pulmonary/Chest: Effort normal. No respiratory distress. She has no wheezes. She exhibits no tenderness.  Abdominal: Soft. Normal aorta and bowel sounds are normal. There is no tenderness. There is no rebound and no guarding.  Musculoskeletal: Normal range of motion.  Neurological: She is alert.  Skin: Skin is warm and dry. She is not diaphoretic. No cyanosis. No pallor.  Psychiatric: She has a normal mood and affect.  Nursing note and vitals reviewed.    ED Treatments / Results  Labs (all labs ordered are listed, but only abnormal results are displayed) Labs Reviewed  COMPREHENSIVE METABOLIC PANEL - Abnormal; Notable for the following components:      Result Value   Glucose, Bld 103 (*)    All other components within normal limits  CBC WITH DIFFERENTIAL/PLATELET  TROPONIN I  D-DIMER, QUANTITATIVE (NOT AT [facility])  I-STAT TROPONIN, ED  I-STAT TROPONIN, ED    EKG EKG Interpretation  Date/Time:  Wednesday February 15 2018 12:42:58 EDT Ventricular Rate:  82 PR Interval:  146 QRS Duration: 84 QT Interval:  386 QTC Calculation: 450 R Axis:   32 Text Interpretation:  Normal sinus rhythm Cannot rule out Anterior infarct , age undetermined Abnormal ECG Confirmed by Mancel BaleWentz, Elliott [id number]), editor Josephine IgoBelcher, Jessica ([id number]) on 02/15/2018 4:37:22 PM   Radiology Dg Chest 2 View  Result Date: 02/15/2018 CLINICAL DATA:  Cough, chest pain for 1 day, former smoking history  EXAM: CHEST - 2 VIEW COMPARISON:  None. FINDINGS: No active infiltrate or effusion is seen. Mediastinal and hilar contours are unremarkable. The heart is within normal limits in size. No bony abnormality is seen. IMPRESSION: No active cardiopulmonary disease. Electronically Signed   By: Dwyane DeePaul  Barry M.D.   On: 02/15/2018 13:21    Procedures Procedures (including critical care time)  Medications Ordered in ED Medications  gi cocktail (Maalox,Lidocaine,Donnatal) (30 mLs Oral Given 02/15/18 1741)     Initial Impression / Assessment and Plan / ED Course  I have reviewed the triage vital signs and the nursing notes.  Pertinent labs & imaging results that were available during my care of the patient were reviewed by me and considered in my medical decision making (see chart for details).     Patient seen and examined.  EKG reviewed by myself.   Vital signs reviewed and are as follows: BP 106/73 (BP  Location: Right Arm)   Pulse 75   Temp 99.6 F (37.6 C) (Oral)   Resp 14   Ht 5\' 6"  (1.676 m)   Wt 72.6 kg (160 lb)   SpO2 98%   BMI 25.82 kg/m   Will check orthostatic vital signs, recheck troponin and EKG, recheck temperature.  If negative, patient will be able to be discharged home with PCP follow-up.  7:17 PM repeat troponin unchanged.  EKG unchanged.  Patient updated.  Patient encouraged to call her primary care doctor tomorrow to schedule follow-up appointment.  Patient was counseled to return with severe chest pain, especially if the pain is crushing or pressure-like and spreads to the arms, back, neck, or jaw, or if they have sweating, nausea, or shortness of breath with the pain. They were encouraged to call 911 with these symptoms.   They were also told to return if their chest pain gets worse and does not go away with rest, they have an attack of chest pain lasting longer than usual despite rest and treatment with the medications their caregiver has prescribed, if they wake from  sleep with chest pain or shortness of breath, if they feel dizzy or faint, if they have chest pain not typical of their usual pain, or if they have any other emergent concerns regarding their health.  The patient verbalized understanding and agreed.    Final Clinical Impressions(s) / ED Diagnoses   Final diagnoses:  Nonspecific chest pain  Near syncope   Patient with chest pain today.  Pain was on the right side and did radiate.  She had near syncope with her symptoms.  Evaluation performed with EKG which was nonischemic and unchanged during emergency department stay.  Troponin negative x2.  D-dimer negative.  Chest x-ray without acute abnormality.  Remainder of lab work reassuring.  Patient with low risk heart score (2).  She appears well at time of discharge.  She has appropriate primary care follow-up.  Feel comfortable with discharge home.  Return instructions as above.  ED Discharge Orders    None       Renne Crigler, Cordelia Poche 02/15/18 1919    Pricilla Loveless, MD 02/15/18 2330

## 2018-02-15 NOTE — ED Notes (Signed)
No answer to vital check, called 3 times

## 2018-02-15 NOTE — Discharge Instructions (Signed)
Please read and follow all provided instructions.  Your diagnoses today include:  1. Nonspecific chest pain   2. Near syncope     Tests performed today include:  An EKG of your heart  A chest x-ray  Cardiac enzymes - a blood test for heart muscle damage  Blood counts and electrolytes  D-dimer - no sign of blood clot  Vital signs. See below for your results today.   Medications prescribed:   None  Take any prescribed medications only as directed.  Follow-up instructions: Please follow-up with your primary care provider as soon as you can for further evaluation of your symptoms.   Return instructions:  SEEK IMMEDIATE MEDICAL ATTENTION IF:  You have severe chest pain, especially if the pain is crushing or pressure-like and spreads to the arms, back, neck, or jaw, or if you have sweating, nausea (feeling sick to your stomach), or shortness of breath. THIS IS AN EMERGENCY. Don't wait to see if the pain will go away. Get medical help at once. Call 911 or 0 (operator). DO NOT drive yourself to the hospital.   Your chest pain gets worse and does not go away with rest.   You have an attack of chest pain lasting longer than usual, despite rest and treatment with the medications your caregiver has prescribed.   You wake from sleep with chest pain or shortness of breath.  You feel dizzy or faint.  You have chest pain not typical of your usual pain for which you originally saw your caregiver.   You have any other emergent concerns regarding your health.  Additional Information: Chest pain comes from many different causes. Your caregiver has diagnosed you as having chest pain that is not specific for one problem, but does not require admission.  You are at low risk for an acute heart condition or other serious illness.   Your vital signs today were: BP 111/74    Pulse 74    Temp 98.3 F (36.8 C)    Resp 15    Ht 5\' 6"  (1.676 m)    Wt 72.6 kg (160 lb)    SpO2 98%    BMI 25.82  kg/m  If your blood pressure (BP) was elevated above 135/85 this visit, please have this repeated by your doctor within one month. --------------

## 2018-02-15 NOTE — ED Triage Notes (Signed)
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 Given 1 nitro, 324 Asprin.

## 2018-02-21 ENCOUNTER — Inpatient Hospital Stay: Payer: BLUE CROSS/BLUE SHIELD | Admitting: Family Medicine

## 2018-02-26 DIAGNOSIS — J209 Acute bronchitis, unspecified: Secondary | ICD-10-CM | POA: Diagnosis not present

## 2018-02-26 DIAGNOSIS — J019 Acute sinusitis, unspecified: Secondary | ICD-10-CM | POA: Diagnosis not present

## 2018-03-30 DIAGNOSIS — F321 Major depressive disorder, single episode, moderate: Secondary | ICD-10-CM | POA: Diagnosis not present

## 2018-04-06 DIAGNOSIS — F321 Major depressive disorder, single episode, moderate: Secondary | ICD-10-CM | POA: Diagnosis not present

## 2018-04-12 DIAGNOSIS — F321 Major depressive disorder, single episode, moderate: Secondary | ICD-10-CM | POA: Diagnosis not present

## 2018-06-26 DIAGNOSIS — H25013 Cortical age-related cataract, bilateral: Secondary | ICD-10-CM | POA: Diagnosis not present

## 2018-06-26 DIAGNOSIS — H2513 Age-related nuclear cataract, bilateral: Secondary | ICD-10-CM | POA: Diagnosis not present

## 2018-06-26 DIAGNOSIS — H5361 Abnormal dark adaptation curve: Secondary | ICD-10-CM | POA: Diagnosis not present

## 2018-06-29 DIAGNOSIS — J3089 Other allergic rhinitis: Secondary | ICD-10-CM | POA: Diagnosis not present

## 2018-06-29 DIAGNOSIS — R062 Wheezing: Secondary | ICD-10-CM | POA: Diagnosis not present

## 2018-06-29 DIAGNOSIS — J3081 Allergic rhinitis due to animal (cat) (dog) hair and dander: Secondary | ICD-10-CM | POA: Diagnosis not present

## 2018-06-29 DIAGNOSIS — J4540 Moderate persistent asthma, uncomplicated: Secondary | ICD-10-CM | POA: Diagnosis not present

## 2018-07-10 DIAGNOSIS — J3081 Allergic rhinitis due to animal (cat) (dog) hair and dander: Secondary | ICD-10-CM | POA: Diagnosis not present

## 2018-07-11 DIAGNOSIS — J3089 Other allergic rhinitis: Secondary | ICD-10-CM | POA: Diagnosis not present

## 2018-08-02 DIAGNOSIS — J3089 Other allergic rhinitis: Secondary | ICD-10-CM | POA: Diagnosis not present

## 2018-08-02 DIAGNOSIS — J3081 Allergic rhinitis due to animal (cat) (dog) hair and dander: Secondary | ICD-10-CM | POA: Diagnosis not present

## 2018-08-04 DIAGNOSIS — J3089 Other allergic rhinitis: Secondary | ICD-10-CM | POA: Diagnosis not present

## 2018-08-04 DIAGNOSIS — J3081 Allergic rhinitis due to animal (cat) (dog) hair and dander: Secondary | ICD-10-CM | POA: Diagnosis not present

## 2018-08-10 DIAGNOSIS — F418 Other specified anxiety disorders: Secondary | ICD-10-CM | POA: Diagnosis not present

## 2018-09-19 DIAGNOSIS — H25812 Combined forms of age-related cataract, left eye: Secondary | ICD-10-CM | POA: Diagnosis not present

## 2018-09-19 DIAGNOSIS — H2512 Age-related nuclear cataract, left eye: Secondary | ICD-10-CM | POA: Diagnosis not present

## 2018-10-11 DIAGNOSIS — H2511 Age-related nuclear cataract, right eye: Secondary | ICD-10-CM | POA: Diagnosis not present

## 2018-10-11 DIAGNOSIS — H25011 Cortical age-related cataract, right eye: Secondary | ICD-10-CM | POA: Diagnosis not present

## 2018-11-03 DIAGNOSIS — J209 Acute bronchitis, unspecified: Secondary | ICD-10-CM | POA: Diagnosis not present

## 2018-11-03 DIAGNOSIS — K2900 Acute gastritis without bleeding: Secondary | ICD-10-CM | POA: Diagnosis not present

## 2018-11-03 DIAGNOSIS — J069 Acute upper respiratory infection, unspecified: Secondary | ICD-10-CM | POA: Diagnosis not present

## 2018-11-03 DIAGNOSIS — F418 Other specified anxiety disorders: Secondary | ICD-10-CM | POA: Diagnosis not present

## 2019-01-09 DIAGNOSIS — Z6824 Body mass index (BMI) 24.0-24.9, adult: Secondary | ICD-10-CM | POA: Diagnosis not present

## 2019-01-09 DIAGNOSIS — Z01419 Encounter for gynecological examination (general) (routine) without abnormal findings: Secondary | ICD-10-CM | POA: Diagnosis not present

## 2019-01-09 DIAGNOSIS — Z1231 Encounter for screening mammogram for malignant neoplasm of breast: Secondary | ICD-10-CM | POA: Diagnosis not present

## 2019-01-17 DIAGNOSIS — F321 Major depressive disorder, single episode, moderate: Secondary | ICD-10-CM | POA: Diagnosis not present

## 2019-01-23 DIAGNOSIS — F321 Major depressive disorder, single episode, moderate: Secondary | ICD-10-CM | POA: Diagnosis not present

## 2019-03-19 DIAGNOSIS — J3089 Other allergic rhinitis: Secondary | ICD-10-CM | POA: Diagnosis not present

## 2019-03-19 DIAGNOSIS — J4540 Moderate persistent asthma, uncomplicated: Secondary | ICD-10-CM | POA: Diagnosis not present

## 2019-03-19 DIAGNOSIS — J3081 Allergic rhinitis due to animal (cat) (dog) hair and dander: Secondary | ICD-10-CM | POA: Diagnosis not present

## 2019-03-23 ENCOUNTER — Encounter: Payer: Self-pay | Admitting: Family Medicine

## 2019-03-23 ENCOUNTER — Ambulatory Visit (INDEPENDENT_AMBULATORY_CARE_PROVIDER_SITE_OTHER): Payer: BLUE CROSS/BLUE SHIELD | Admitting: Family Medicine

## 2019-03-23 ENCOUNTER — Telehealth: Payer: Self-pay | Admitting: Family Medicine

## 2019-03-23 VITALS — Temp 97.7°F | Ht 66.0 in

## 2019-03-23 DIAGNOSIS — B370 Candidal stomatitis: Secondary | ICD-10-CM | POA: Insufficient documentation

## 2019-03-23 DIAGNOSIS — J4530 Mild persistent asthma, uncomplicated: Secondary | ICD-10-CM

## 2019-03-23 MED ORDER — NYSTATIN 100000 UNIT/ML MT SUSP: 5.0000 mL | Freq: Four times a day (QID) | OROMUCOSAL | 0 refills | Status: AC

## 2019-03-23 MED ORDER — FLUCONAZOLE 150 MG PO TABS: ORAL_TABLET | ORAL | 0 refills | Status: AC

## 2019-03-23 NOTE — Patient Instructions (Signed)
Increase wixela up to 2 inhalations a.m./ 1 inhalation p.m. Gargle with Listerine after each use. Restart Zyrtec nightly.  May need to restart Singulair as well Albuterol as needed monitor  symptoms and thrush development on this particular inhaler and if recurs may need to switch types of med. If tolerating higher doses and is more effective, recommend increase dose to wixela 500/50 inhaler twice daily. 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 Ttalk with your employer and see if you can take more frequent breaks outside daily without mask.  Thrush Nystatin swish prescribed.  Diflucan tab prescribed. swish with Listerine after use of inhaler. If not improved to follow-up in 1 week.    Asthma, Adult  Asthma is a long-term (chronic) condition in which the airways get tight and narrow. The airways are the breathing passages that lead from the nose and mouth down into the lungs. A person with asthma will have times when symptoms get worse. These are called asthma attacks. They can cause coughing, whistling sounds when you breathe (wheezing), shortness of breath, and chest pain. They can make it hard to breathe. There is no cure for asthma, but medicines and lifestyle changes can help control it. There are many things that can bring on an asthma attack or make asthma symptoms worse (triggers). Common triggers include:  Mold.  Dust.  Cigarette smoke.  Cockroaches.  Things that can cause allergy symptoms (allergens). These include animal skin flakes (dander) and pollen from trees or grass.  Things that pollute the air. These may include household cleaners, wood smoke, smog, or chemical odors.  Cold air, weather changes, and wind.  Crying or laughing hard.  Stress.  Certain medicines or drugs.  Certain foods such as dried fruit, potato chips, and grape juice.   Infections, such as a cold or the flu.  Certain medical conditions or diseases.  Exercise or tiring activities. Asthma may be treated with medicines and by staying away from the things that cause asthma attacks. Types of medicines may include:  Controller medicines. These help prevent asthma symptoms. They are usually taken every day.  Fast-acting reliever or rescue medicines. These quickly relieve asthma symptoms. They are used as needed and provide short-term relief.  Allergy medicines if your attacks are brought on by allergens.  Medicines to help control the body's defense (immune) system. Follow these instructions at home: Avoiding triggers in your home  Change your heating and air conditioning filter often.  Limit your use of fireplaces and wood stoves.  Get rid of pests (such as roaches and mice) and their droppings.  Throw away plants if you see mold on them.  Clean your floors. Dust regularly. Use cleaning products that do not smell.  Have someone vacuum when you are not home. Use a vacuum cleaner with a HEPA filter if possible.  Replace carpet with wood, tile, or vinyl flooring. Carpet can trap animal skin flakes and dust.  Use allergy-proof pillows, mattress covers, and box spring covers.  Wash bed sheets and blankets every week in hot water. Dry them in a dryer.  Keep your bedroom free of any triggers.  Avoid pets and keep windows closed when things that cause allergy symptoms are in the air.  Use blankets that are made of polyester or cotton.  Clean bathrooms and kitchens with bleach. If possible, have someone repaint the walls in these rooms with mold-resistant  paint. Keep out of the rooms that are being cleaned and painted.  Wash your hands often with soap and water. If soap and water are not available, use hand sanitizer.  Do not allow anyone to smoke in your home. General instructions  Take over-the-counter and prescription medicines only as told by  your doctor. ? Talk with your doctor if you have questions about how or when to take your medicines. ? Make note if you need to use your medicines more often than usual.  Do not use any products that contain nicotine or tobacco, such as cigarettes and e-cigarettes. If you need help quitting, ask your doctor.  Stay away from secondhand smoke.  Avoid doing things outdoors when allergen counts are high and when air quality is low.  Wear a ski mask when doing outdoor activities in the winter. The mask should cover your nose and mouth. Exercise indoors on cold days if you can.  Warm up before you exercise. Take time to cool down after exercise.  Use a peak flow meter as told by your doctor. A peak flow meter is a tool that measures how well the lungs are working.  Keep track of the peak flow meter's readings. Write them down.  Follow your asthma action plan. This is a written plan for taking care of your asthma and treating your attacks.  Make sure you get all the shots (vaccines) that your doctor recommends. Ask your doctor about a flu shot and a pneumonia shot.  Keep all follow-up visits as told by your doctor. This is important. Contact a doctor if:  You have wheezing, shortness of breath, or a cough even while taking medicine to prevent attacks.  The mucus you cough up (sputum) is thicker than usual.  The mucus you cough up changes from clear or white to yellow, green, gray, or bloody.  You have problems from the medicine you are taking, such as: ? A rash. ? Itching. ? Swelling. ? Trouble breathing.  You need reliever medicines more than 2-3 times a week.  Your peak flow reading is still at 50-79% of your personal best after following the action plan for 1 hour.  You have a fever. Get help right away if:  You seem to be worse and are not responding to medicine during an asthma attack.  You are short of breath even at rest.  You get short of breath when doing very little  activity.  You have trouble eating, drinking, or talking.  You have chest pain or tightness.  You have a fast heartbeat.  Your lips or fingernails start to turn blue.  You are light-headed or dizzy, or you faint.  Your peak flow is less than 50% of your personal best.  You feel too tired to breathe normally. Summary  Asthma is a long-term (chronic) condition in which the airways get tight and narrow. An asthma attack can make it hard to breathe.  Asthma cannot be cured, but medicines and lifestyle changes can help control it.  Make sure you understand how to avoid triggers and how and when to use your medicines. This information is not intended to replace advice given to you by your health care provider. Make sure you discuss any questions you have with your health care provider. Document Released: 04/05/2008 Document Revised: 11/22/2016 Document Reviewed: 11/22/2016 Elsevier Interactive Patient Education  2019 [employer].

## 2019-03-23 NOTE — Progress Notes (Signed)
VIRTUAL VISIT VIA VIDEO  I connected with Ashley Clayton on 03/23/19 at  9:40 AM EDT by a video enabled telemedicine application and verified that I am speaking with the correct person using two identifiers. Location patient: Home Location provider: [facility], Office Persons participating in the virtual visit: Patient, Dr. Claiborne Billings and R.Baker, LPN  I discussed the limitations of evaluation and management by telemedicine and the availability of in person appointments. The patient expressed understanding and agreed to proceed. Patient Care Team    Relationship Specialty Notifications Start End  Natalia Leatherwood, DO PCP - General Family Medicine  06/07/16   Asthma, [facility]  03/23/19   Mitchel Honour, DO Consulting Physician Obstetrics and Gynecology  03/23/19      SUBJECTIVE Chief Complaint  Patient presents with   Shortness of Breath    Pt has been more SOB since having to wear a mask as work. No fever. Denies N/V/D.    Thrush    x1 day     HPI:  Ashley Clayton is a 53 y.o. female present for asthma and increased shortness of breath with N95 mask. She also complains of thrush for 1-2 days.  Patient reports she is having difficulty adjusting to the continued mask wearing that is mandated at her employment.  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  She has increased fatigue.  She has a history of asthma and is followed by [facility].  Had been on Breo inhaler in the past up until January.  At her last appointment with her asthma specialist 2 to 3 weeks ago she was started on Wixella/Advair since [place] had become too expensive.  She has noticed a sore on the side of her tongue and thrush development since yesterday.  She states she does rinse with water after using her inhaler.  She also noticed a small lymph node underneath her  chin.  She is not on any antihistamines or singulair.  He denies fever, cough, chills or upper respiratory symptoms.  She feels increased fatigue is from CO2.  On weekends when she is not working, she reports she feels fine.  ROS: See pertinent positives and negatives per HPI.  Patient Active Problem List   Diagnosis Date Noted   Thrush 03/23/2019   Hyperlipidemia 06/08/2016   Arthralgia 06/08/2016   Asthma, mild 08/07/2014    Social History   Tobacco Use   Smoking status: Former Smoker    Types: Cigarettes   Smokeless tobacco: Never Used  Substance Use Topics   Alcohol use: Yes    Comment: occasional    Current Outpatient Medications:    albuterol (PROAIR HFA) 108 (90 Base) MCG/ACT inhaler, Inhale 2 puffs into the lungs every 6 (six) hours as needed for wheezing or shortness of breath., Disp: , Rfl:    ibuprofen (ADVIL,MOTRIN) 200 MG tablet, Take 200 mg by mouth every 6 (six) hours as needed (for pain or headaches)., Disp: , Rfl:    Probiotic Product (RESTORA) CAPS, Take 1 capsule by mouth daily., Disp: , Rfl:    WIXELA INHUB 250-50 MCG/DOSE AEPB, TAKE 1 PUFF BY MOUTH TWICE A DAY, Disp: , Rfl:    fluconazole (DIFLUCAN) 150 MG tablet, 1 tab PO once, may repeat dose in 1 week if symptoms remain for 2 additional doses., Disp: 3 tablet, Rfl: 0  nystatin (MYCOSTATIN) 100000 UNIT/ML suspension, Take 5 mLs (500,000 Units total) by mouth 4 (four) times daily. 5 ml swish for 30 seconds and then spit out, QID, do not swallow., Disp: 100 mL, Rfl: 0  No Known Allergies  OBJECTIVE: Temp 97.7 F (36.5 C) (Temporal)    Ht 5\' 6"  (1.676 m)    BMI 25.82 kg/m  Gen: No acute distress. Nontoxic in appearance.  HENT: AT. .  MMM.  Eyes:Pupils Equal Round Reactive to light, Extraocular movements intact,  Conjunctiva without redness, discharge or icterus. CV: no edema Chest: Cough or shortness of breath not present Neuro:   Alert. Oriented x3  Psych: Normal affect, dress and  demeanor. Normal speech. Normal thought content and judgment.  ASSESSMENT AND PLAN: Ashley Clayton is a 53 y.o. female present for  Mild asthma  persistent Increase wixela up to 2 inhalations a.m./ 1 inhalation p.m. Gargle with Listerine after each use. Restart Zyrtec nightly.  May need to restart Singulair as well, however she would like to hold off on this medicine for now. Albuterol as needed She will monitor her symptoms and thrush development on this particular inhaler and if recurs may need to switch types of med. If tolerating higher doses and is more effective, when she is in need for refills recommended she increase her dose to wixela 500/50 inhaler twice daily. 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  Advised her to talk with her employer and see if she can take more frequent breaks outside daily without mask.  Thrush Nystatin swish prescribed.  Diflucan tab prescribed. She was encouraged to swish with Listerine after use of inhaler. If not improved to follow-up in 1 week.    > 25 minutes spent with patient, >50% of time spent face to face counseling     Felix PaciniRenee Euell Schiff, DO 03/23/2019

## 2019-03-23 NOTE — Telephone Encounter (Signed)
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 She thinks she has COVID  Please contact

## 2019-03-23 NOTE — Telephone Encounter (Signed)
Pt was called and scheduled for virtual visit  

## 2019-03-28 DIAGNOSIS — F321 Major depressive disorder, single episode, moderate: Secondary | ICD-10-CM | POA: Diagnosis not present

## 2019-03-29 ENCOUNTER — Telehealth: Payer: Self-pay | Admitting: Family Medicine

## 2019-03-29 NOTE — Telephone Encounter (Signed)
Patient called into office. She is not any better from last week.  SOB, fatigued, passed at work yesterday  Please call asap. If she does not answer leave VM and she will call right back  Thank you

## 2019-03-29 NOTE — Telephone Encounter (Signed)
Called and spoke with patient, she continues to have sore throat but thrush is gone, pt is SOB with any exertion, pt states it is taking a long time to recover. Pt did not pass out at work she just walked too much and felt SOB and had to sit. Felt like her knees gave out but never lost consciousness.  MD at work gave her a liter of fluid and O2 stayed at 100% in supine position on stretcher. Pt has N/V/D and Denies fever. Pt has labored breathing on phone.  Spoke with Dr Claiborne Billings who advised for pt to go to ED to be evaluated. Pt was called and told to go to [facility], get someone to give her a ride or call EMS given how SOB pt was on the phone. Pt verbalized understanding.

## 2019-04-23 DIAGNOSIS — F418 Other specified anxiety disorders: Secondary | ICD-10-CM | POA: Diagnosis not present

## 2019-05-06 DIAGNOSIS — Z20828 Contact with and (suspected) exposure to other viral communicable diseases: Secondary | ICD-10-CM | POA: Diagnosis not present

## 2019-05-06 DIAGNOSIS — Z711 Person with feared health complaint in whom no diagnosis is made: Secondary | ICD-10-CM | POA: Diagnosis not present

## 2019-05-14 DIAGNOSIS — F418 Other specified anxiety disorders: Secondary | ICD-10-CM | POA: Diagnosis not present

## 2019-07-02 DIAGNOSIS — F321 Major depressive disorder, single episode, moderate: Secondary | ICD-10-CM | POA: Diagnosis not present

## 2019-07-03 DIAGNOSIS — H25811 Combined forms of age-related cataract, right eye: Secondary | ICD-10-CM | POA: Diagnosis not present

## 2019-07-03 DIAGNOSIS — H2511 Age-related nuclear cataract, right eye: Secondary | ICD-10-CM | POA: Diagnosis not present

## 2019-07-10 DIAGNOSIS — F329 Major depressive disorder, single episode, unspecified: Secondary | ICD-10-CM | POA: Diagnosis not present

## 2019-07-17 DIAGNOSIS — F332 Major depressive disorder, recurrent severe without psychotic features: Secondary | ICD-10-CM | POA: Diagnosis not present

## 2019-07-30 IMAGING — DX DG ABDOMEN 2V
2 series · 2 of 2 positions shown · non-contrast
Comparison: None.

CLINICAL DATA: abd pain, nausea, constipation, x 1 wk, hx of
cholecystomy.

EXAM:
ABDOMEN - 2 VIEW

[abdomen erect]
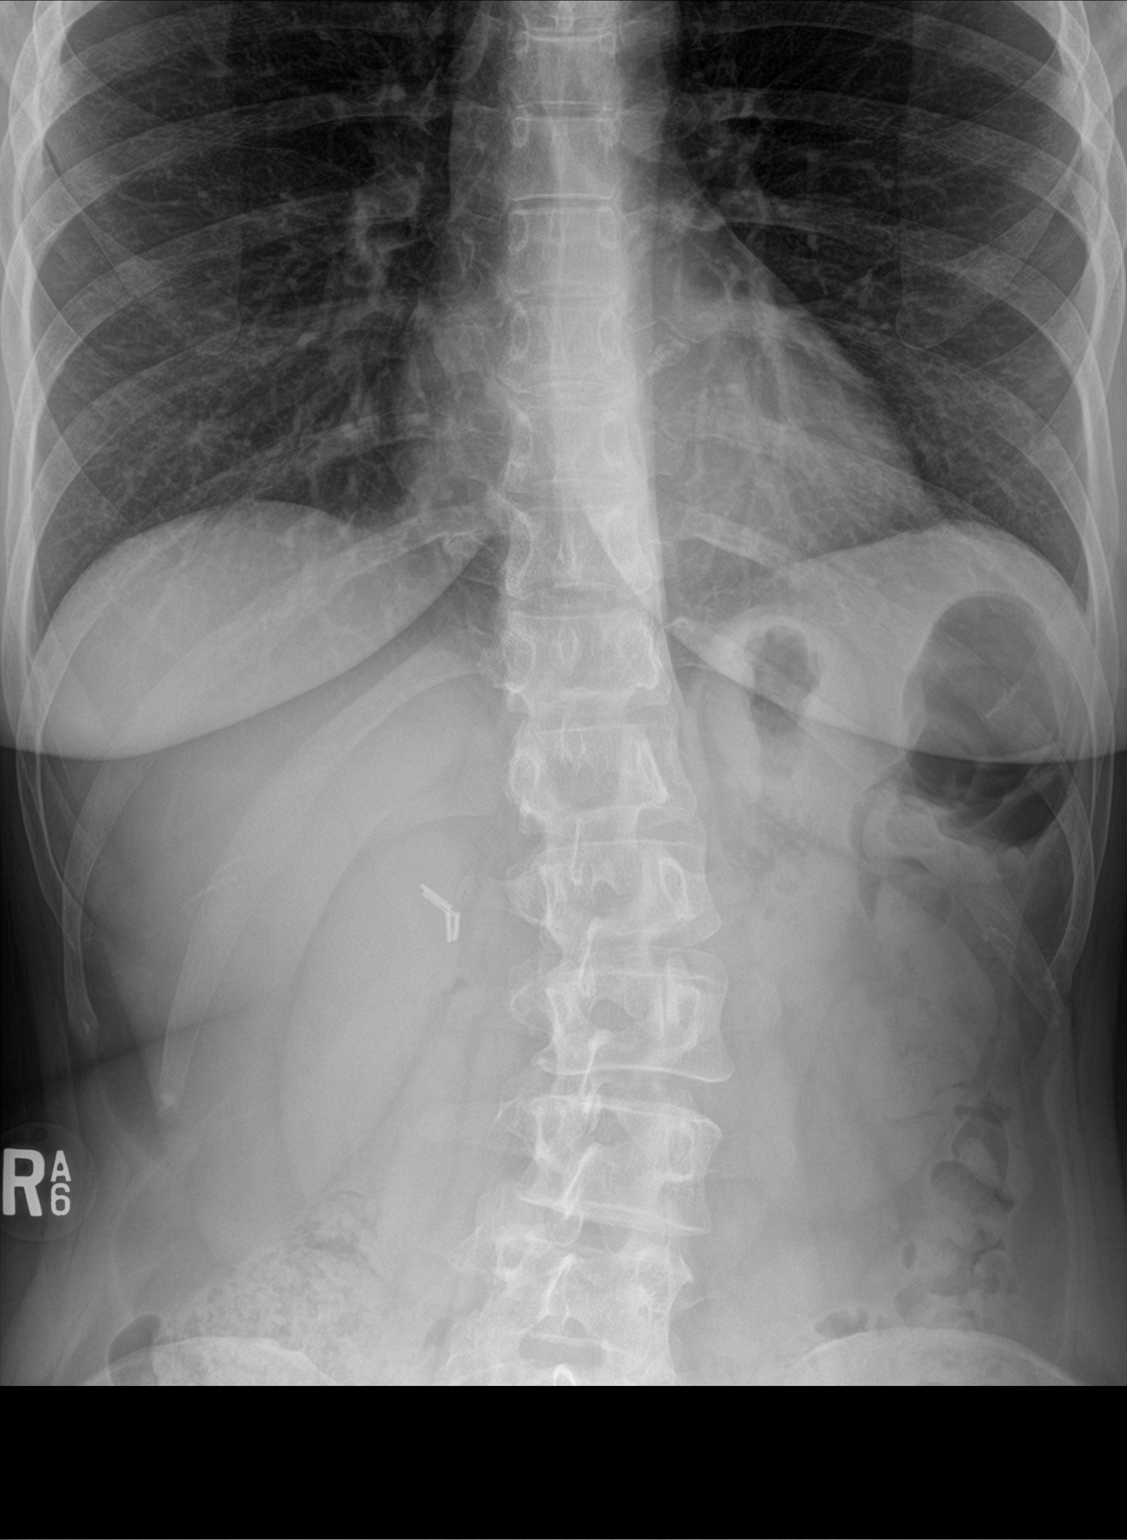

[abdomen supine]
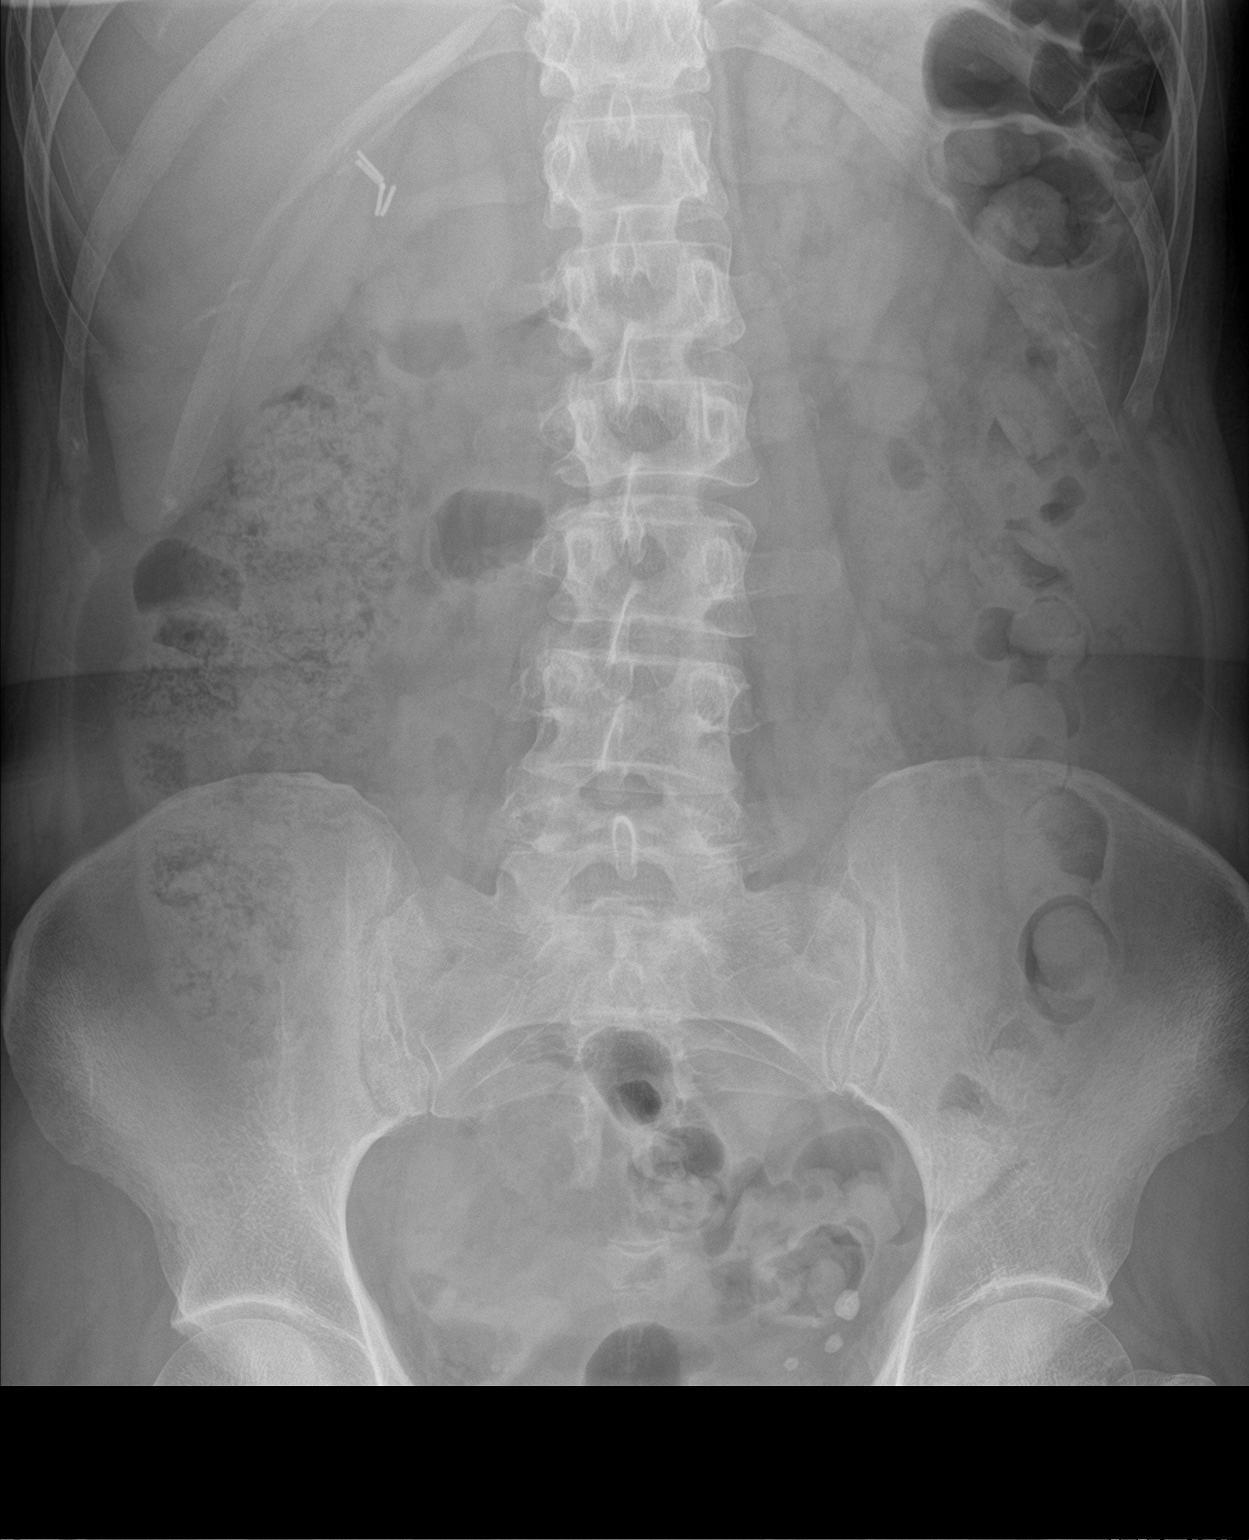

[2 of 2 positions shown; findings below may reference images not displayed]

FINDINGS: Mild lumbar levoscoliosis. Visualized lung bases clear. No free air.

Paucity of small bowel gas. Moderate proximal colonic fecal
material, decompressed distally.

Left pelvic phleboliths.  Cholecystectomy clips.
IMPRESSION: 1. Nonobstructive bowel gas pattern with moderate proximal colonic
fecal material.
2. No free air.

## 2019-10-30 DIAGNOSIS — L723 Sebaceous cyst: Secondary | ICD-10-CM | POA: Diagnosis not present

## 2019-10-30 DIAGNOSIS — L7211 Pilar cyst: Secondary | ICD-10-CM | POA: Diagnosis not present

## 2019-11-28 DIAGNOSIS — J3089 Other allergic rhinitis: Secondary | ICD-10-CM | POA: Diagnosis not present

## 2019-11-28 DIAGNOSIS — J4540 Moderate persistent asthma, uncomplicated: Secondary | ICD-10-CM | POA: Diagnosis not present

## 2019-11-28 DIAGNOSIS — J3081 Allergic rhinitis due to animal (cat) (dog) hair and dander: Secondary | ICD-10-CM | POA: Diagnosis not present

## 2020-01-10 DIAGNOSIS — Z6821 Body mass index (BMI) 21.0-21.9, adult: Secondary | ICD-10-CM | POA: Diagnosis not present

## 2020-01-10 DIAGNOSIS — Z1231 Encounter for screening mammogram for malignant neoplasm of breast: Secondary | ICD-10-CM | POA: Diagnosis not present

## 2020-01-10 DIAGNOSIS — Z13820 Encounter for screening for osteoporosis: Secondary | ICD-10-CM | POA: Diagnosis not present

## 2020-01-10 DIAGNOSIS — Z01419 Encounter for gynecological examination (general) (routine) without abnormal findings: Secondary | ICD-10-CM | POA: Diagnosis not present

## 2020-02-03 IMAGING — CR DG CHEST 2V
2 series · 2 of 2 positions shown · non-contrast
Comparison: None.

CLINICAL DATA: Cough, chest pain for 1 day, former smoking history

EXAM:
CHEST - 2 VIEW

[chest lat]
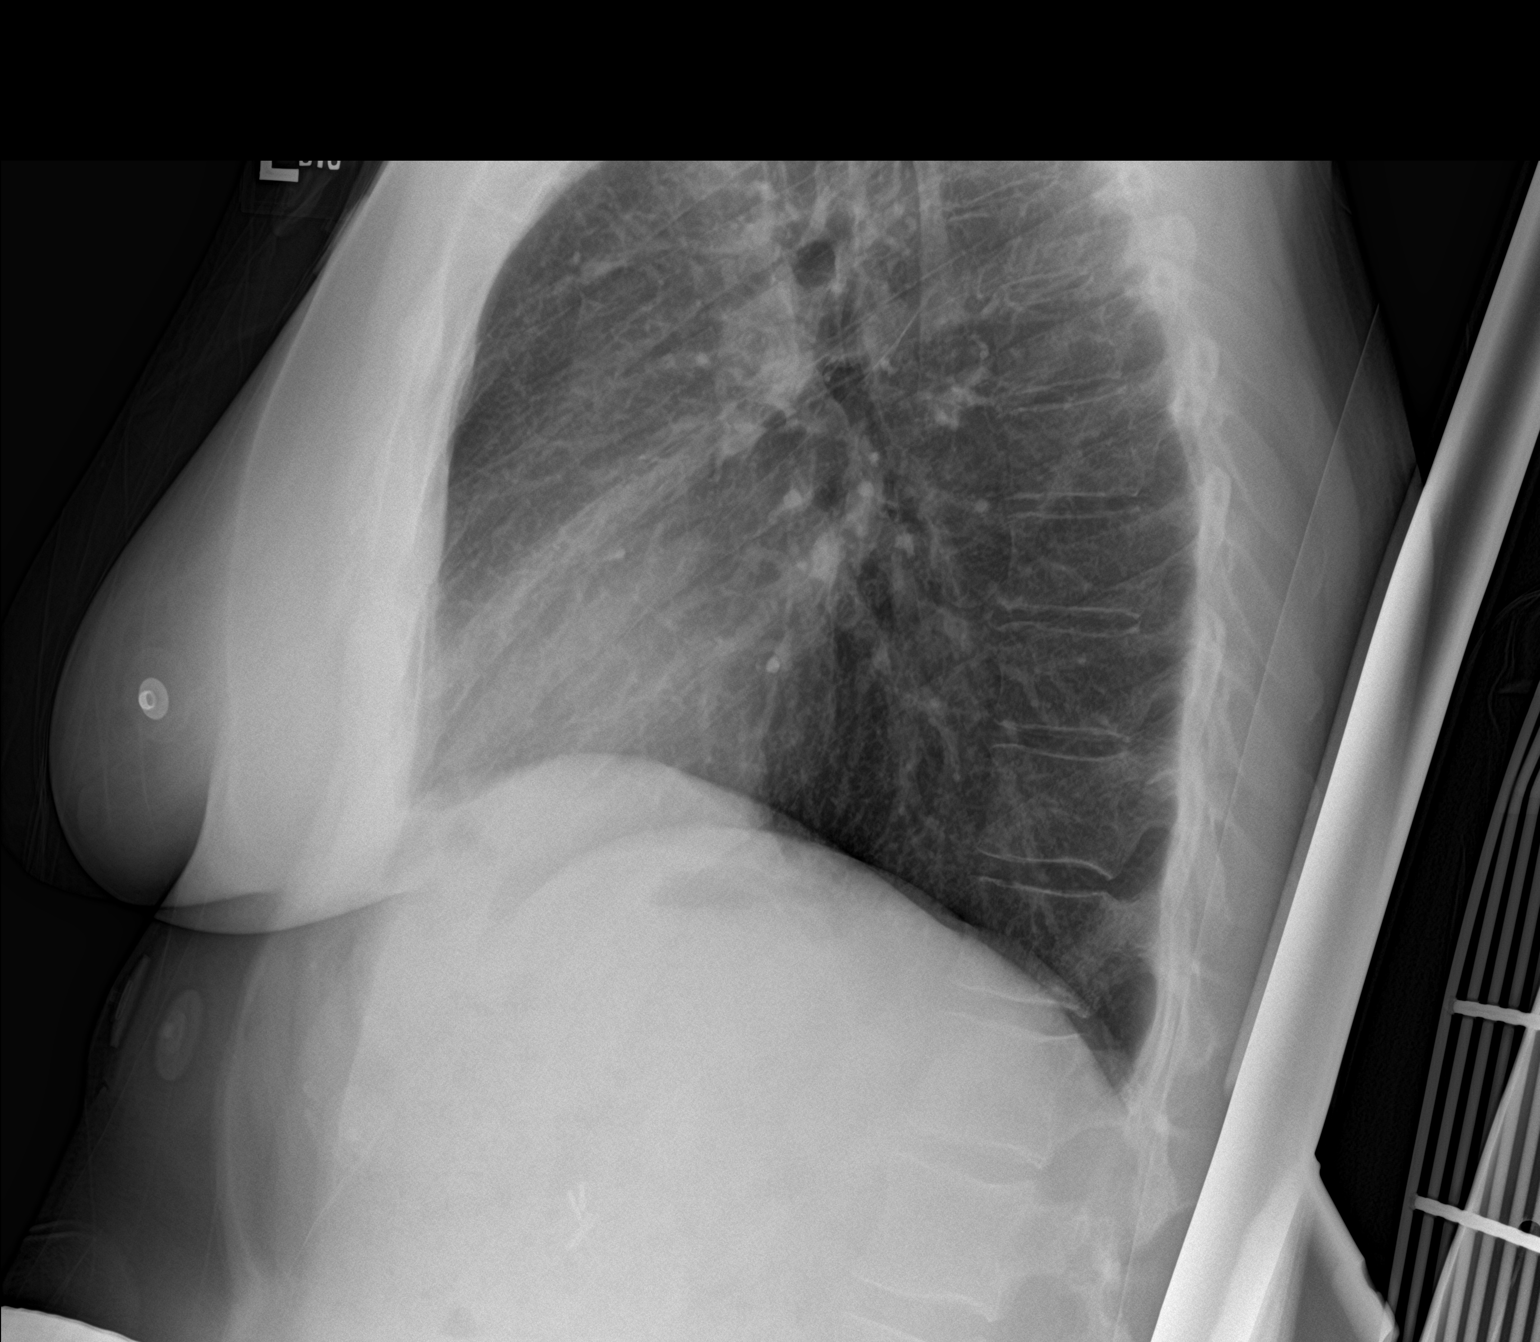

[chest ap]
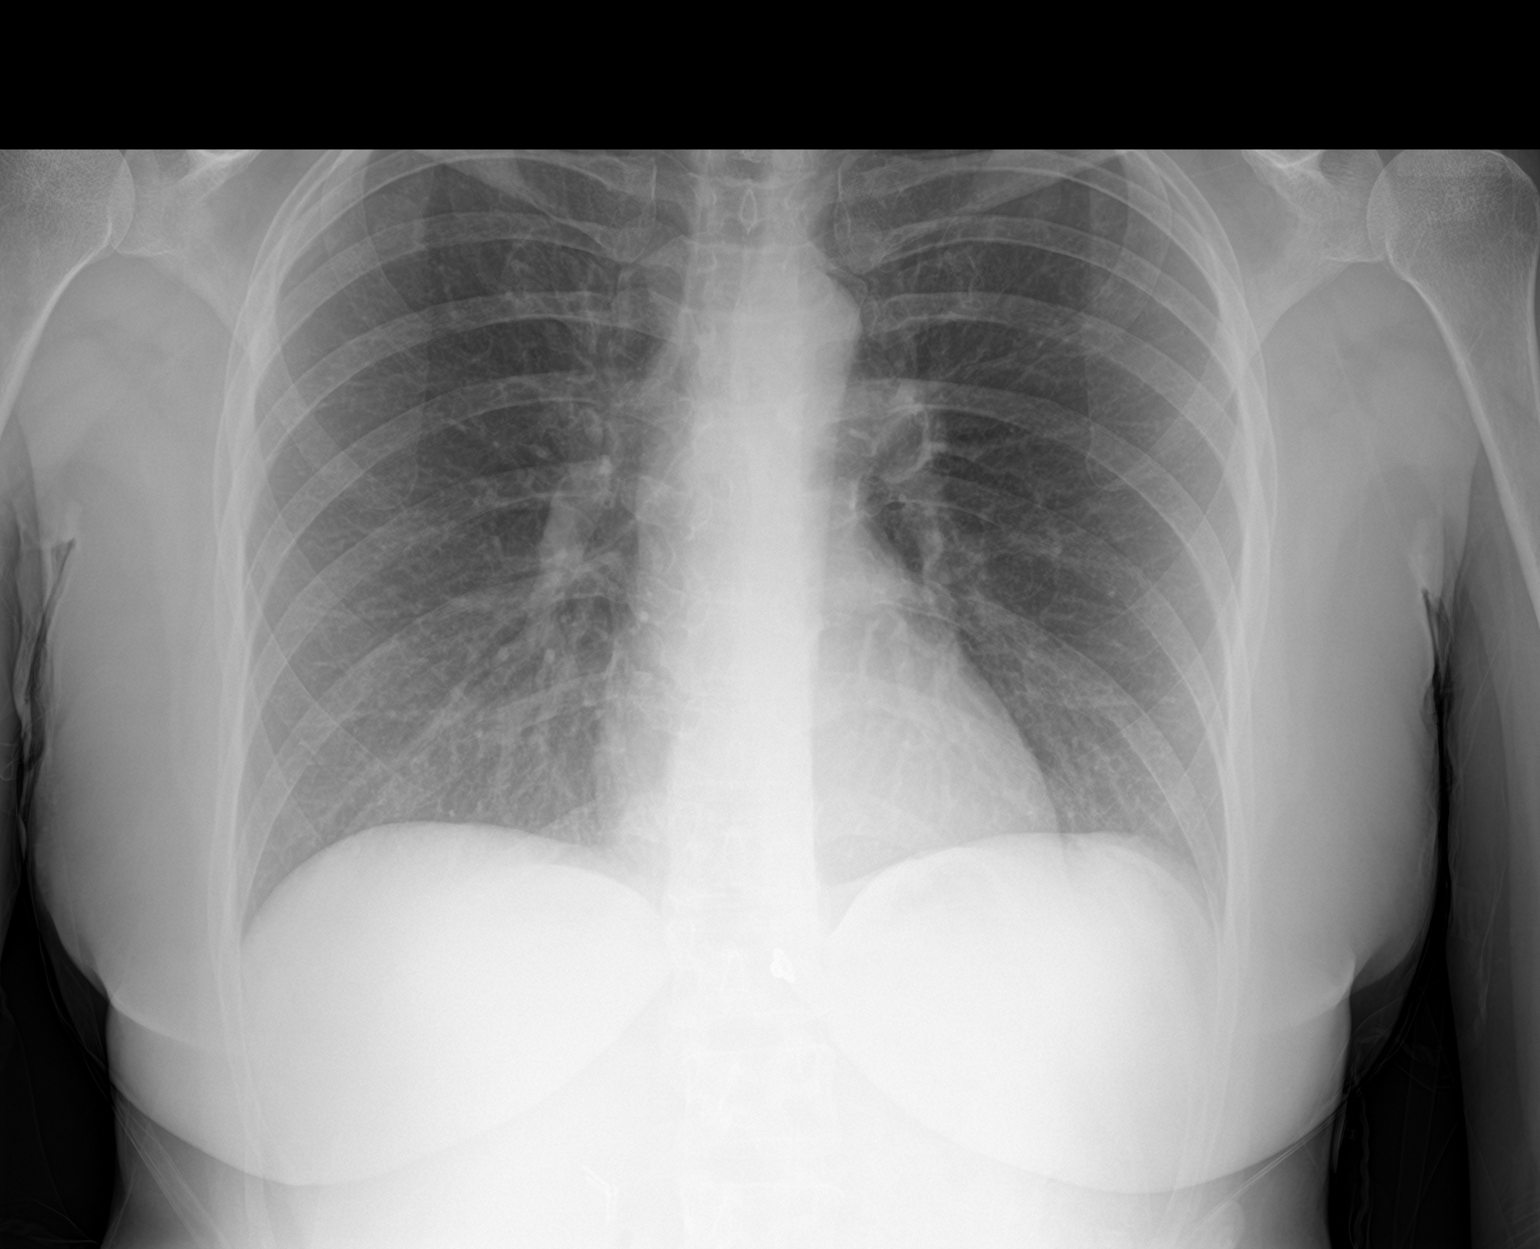

[2 of 2 positions shown; findings below may reference images not displayed]

FINDINGS: No active infiltrate or effusion is seen. Mediastinal and hilar
contours are unremarkable. The heart is within normal limits in
size. No bony abnormality is seen.
IMPRESSION: No active cardiopulmonary disease.

## 2020-02-14 DIAGNOSIS — S0511XA Contusion of eyeball and orbital tissues, right eye, initial encounter: Secondary | ICD-10-CM | POA: Diagnosis not present

## 2020-02-14 DIAGNOSIS — H04123 Dry eye syndrome of bilateral lacrimal glands: Secondary | ICD-10-CM | POA: Diagnosis not present

## 2020-02-14 DIAGNOSIS — Z961 Presence of intraocular lens: Secondary | ICD-10-CM | POA: Diagnosis not present

## 2020-02-14 DIAGNOSIS — H26493 Other secondary cataract, bilateral: Secondary | ICD-10-CM | POA: Diagnosis not present
